# Patient Record
Sex: Male | Born: 1955 | Race: White | Hispanic: No | Marital: Married
Health system: Southern US, Community
[De-identification: ages and names within clinical notes are randomized; demographics above are authoritative.]

---

## 2005-05-05 ENCOUNTER — Emergency Department: Payer: Self-pay | Admitting: Internal Medicine

## 2008-12-20 ENCOUNTER — Ambulatory Visit: Payer: Self-pay | Admitting: Gastroenterology

## 2017-12-02 ENCOUNTER — Inpatient Hospital Stay (HOSPITAL_COMMUNITY): Payer: BC Managed Care – PPO

## 2017-12-02 ENCOUNTER — Emergency Department: Payer: BC Managed Care – PPO

## 2017-12-02 ENCOUNTER — Inpatient Hospital Stay (HOSPITAL_COMMUNITY)
Admission: EM | Admit: 2017-12-02 | Discharge: 2018-01-03 | DRG: 064 | Disposition: E | Payer: BC Managed Care – PPO | Attending: Neurology | Admitting: Neurology

## 2017-12-02 ENCOUNTER — Emergency Department (HOSPITAL_COMMUNITY): Payer: BC Managed Care – PPO

## 2017-12-02 ENCOUNTER — Emergency Department
Admission: EM | Admit: 2017-12-02 | Discharge: 2017-12-02 | Disposition: A | Discharge status: Other Acute Inpatient Hospital | Payer: BC Managed Care – PPO | Attending: Student in an Organized Health Care Education/Training Program | Admitting: Student in an Organized Health Care Education/Training Program

## 2017-12-02 DIAGNOSIS — I63511 Cerebral infarction due to unspecified occlusion or stenosis of right middle cerebral artery: Principal | ICD-10-CM | POA: Diagnosis present

## 2017-12-02 DIAGNOSIS — R402112 Coma scale, eyes open, never, at arrival to emergency department: Secondary | ICD-10-CM | POA: Diagnosis present

## 2017-12-02 DIAGNOSIS — Y92009 Unspecified place in unspecified non-institutional (private) residence as the place of occurrence of the external cause: Secondary | ICD-10-CM

## 2017-12-02 DIAGNOSIS — G8194 Hemiplegia, unspecified affecting left nondominant side: Secondary | ICD-10-CM | POA: Diagnosis present

## 2017-12-02 DIAGNOSIS — Z8673 Personal history of transient ischemic attack (TIA), and cerebral infarction without residual deficits: Secondary | ICD-10-CM | POA: Diagnosis not present

## 2017-12-02 DIAGNOSIS — R402352 Coma scale, best motor response, localizes pain, at arrival to emergency department: Secondary | ICD-10-CM | POA: Diagnosis present

## 2017-12-02 DIAGNOSIS — J9690 Respiratory failure, unspecified, unspecified whether with hypoxia or hypercapnia: Secondary | ICD-10-CM | POA: Diagnosis not present

## 2017-12-02 DIAGNOSIS — F17210 Nicotine dependence, cigarettes, uncomplicated: Secondary | ICD-10-CM | POA: Diagnosis present

## 2017-12-02 DIAGNOSIS — F1010 Alcohol abuse, uncomplicated: Secondary | ICD-10-CM

## 2017-12-02 DIAGNOSIS — G936 Cerebral edema: Secondary | ICD-10-CM | POA: Diagnosis present

## 2017-12-02 DIAGNOSIS — I6521 Occlusion and stenosis of right carotid artery: Secondary | ICD-10-CM | POA: Diagnosis not present

## 2017-12-02 DIAGNOSIS — G9340 Encephalopathy, unspecified: Secondary | ICD-10-CM | POA: Insufficient documentation

## 2017-12-02 DIAGNOSIS — Z01818 Encounter for other preprocedural examination: Secondary | ICD-10-CM

## 2017-12-02 DIAGNOSIS — W19XXXA Unspecified fall, initial encounter: Secondary | ICD-10-CM | POA: Diagnosis present

## 2017-12-02 DIAGNOSIS — R29810 Facial weakness: Secondary | ICD-10-CM | POA: Diagnosis present

## 2017-12-02 DIAGNOSIS — Z66 Do not resuscitate: Secondary | ICD-10-CM | POA: Diagnosis present

## 2017-12-02 DIAGNOSIS — I63231 Cerebral infarction due to unspecified occlusion or stenosis of right carotid arteries: Secondary | ICD-10-CM | POA: Diagnosis not present

## 2017-12-02 DIAGNOSIS — F17200 Nicotine dependence, unspecified, uncomplicated: Secondary | ICD-10-CM

## 2017-12-02 DIAGNOSIS — I639 Cerebral infarction, unspecified: Secondary | ICD-10-CM | POA: Diagnosis present

## 2017-12-02 DIAGNOSIS — G935 Compression of brain: Secondary | ICD-10-CM | POA: Diagnosis not present

## 2017-12-02 DIAGNOSIS — I63512 Cerebral infarction due to unspecified occlusion or stenosis of left middle cerebral artery: Secondary | ICD-10-CM | POA: Insufficient documentation

## 2017-12-02 DIAGNOSIS — Z91018 Allergy to other foods: Secondary | ICD-10-CM | POA: Diagnosis not present

## 2017-12-02 DIAGNOSIS — R402212 Coma scale, best verbal response, none, at arrival to emergency department: Secondary | ICD-10-CM | POA: Diagnosis present

## 2017-12-02 DIAGNOSIS — R29717 NIHSS score 17: Secondary | ICD-10-CM | POA: Diagnosis present

## 2017-12-02 DIAGNOSIS — Z515 Encounter for palliative care: Secondary | ICD-10-CM | POA: Diagnosis present

## 2017-12-02 DIAGNOSIS — Z4659 Encounter for fitting and adjustment of other gastrointestinal appliance and device: Secondary | ICD-10-CM

## 2017-12-02 DIAGNOSIS — Z9282 Status post administration of tPA (rtPA) in a different facility within the last 24 hours prior to admission to current facility: Secondary | ICD-10-CM

## 2017-12-02 DIAGNOSIS — Z87820 Personal history of traumatic brain injury: Secondary | ICD-10-CM

## 2017-12-02 DIAGNOSIS — I63031 Cerebral infarction due to thrombosis of right carotid artery: Secondary | ICD-10-CM

## 2017-12-02 DIAGNOSIS — J9600 Acute respiratory failure, unspecified whether with hypoxia or hypercapnia: Secondary | ICD-10-CM | POA: Diagnosis not present

## 2017-12-02 DIAGNOSIS — R4182 Altered mental status, unspecified: Secondary | ICD-10-CM | POA: Diagnosis present

## 2017-12-02 LAB — POCT I-STAT 3, ART BLOOD GAS (G3+)
ACID-BASE EXCESS: 4 mmol/L — AB (ref 0.0–2.0)
Bicarbonate: 28.3 mmol/L — ABNORMAL HIGH (ref 20.0–28.0)
O2 SAT: 98 %
PH ART: 7.458 — AB (ref 7.350–7.450)
TCO2: 30 mmol/L (ref 22–32)
pCO2 arterial: 39.8 mmHg (ref 32.0–48.0)
pO2, Arterial: 99 mmHg (ref 83.0–108.0)

## 2017-12-02 LAB — COMPREHENSIVE METABOLIC PANEL
ALK PHOS: 121 U/L (ref 38–126)
ALT: 12 U/L — ABNORMAL LOW (ref 17–63)
ANION GAP: 11 (ref 5–15)
AST: 28 U/L (ref 15–41)
Albumin: 3.4 g/dL — ABNORMAL LOW (ref 3.5–5.0)
BILIRUBIN TOTAL: 1.6 mg/dL — AB (ref 0.3–1.2)
BUN: 9 mg/dL (ref 6–20)
CALCIUM: 7.9 mg/dL — AB (ref 8.9–10.3)
CO2: 29 mmol/L (ref 22–32)
Chloride: 99 mmol/L — ABNORMAL LOW (ref 101–111)
Creatinine, Ser: 0.71 mg/dL (ref 0.61–1.24)
Glucose, Bld: 164 mg/dL — ABNORMAL HIGH (ref 65–99)
Potassium: 4 mmol/L (ref 3.5–5.1)
SODIUM: 139 mmol/L (ref 135–145)
TOTAL PROTEIN: 6.9 g/dL (ref 6.5–8.1)

## 2017-12-02 LAB — URINE DRUG SCREEN, QUALITATIVE (ARMC ONLY)
Amphetamines, Ur Screen: NOT DETECTED
BENZODIAZEPINE, UR SCRN: NOT DETECTED
Barbiturates, Ur Screen: NOT DETECTED
CANNABINOID 50 NG, UR ~~LOC~~: POSITIVE — AB
Cocaine Metabolite,Ur ~~LOC~~: NOT DETECTED
MDMA (ECSTASY) UR SCREEN: NOT DETECTED
Methadone Scn, Ur: NOT DETECTED
Opiate, Ur Screen: NOT DETECTED
PHENCYCLIDINE (PCP) UR S: NOT DETECTED
TRICYCLIC, UR SCREEN: NOT DETECTED

## 2017-12-02 LAB — BLOOD GAS, VENOUS
Acid-Base Excess: 4.8 mmol/L — ABNORMAL HIGH (ref 0.0–2.0)
BICARBONATE: 30.5 mmol/L — AB (ref 20.0–28.0)
Patient temperature: 37
pCO2, Ven: 47 mmHg (ref 44.0–60.0)
pH, Ven: 7.42 (ref 7.250–7.430)

## 2017-12-02 LAB — ETHANOL

## 2017-12-02 LAB — CBC WITH DIFFERENTIAL/PLATELET
BASOS ABS: 0.1 10*3/uL (ref 0–0.1)
Basophils Relative: 1 %
Eosinophils Absolute: 0 10*3/uL (ref 0–0.7)
Eosinophils Relative: 0 %
HEMATOCRIT: 52 % (ref 40.0–52.0)
HEMOGLOBIN: 17.2 g/dL (ref 13.0–18.0)
LYMPHS ABS: 1.2 10*3/uL (ref 1.0–3.6)
LYMPHS PCT: 11 %
MCH: 29.6 pg (ref 26.0–34.0)
MCHC: 33.1 g/dL (ref 32.0–36.0)
MCV: 89.4 fL (ref 80.0–100.0)
Monocytes Absolute: 0.8 10*3/uL (ref 0.2–1.0)
Monocytes Relative: 7 %
NEUTROS ABS: 9 10*3/uL — AB (ref 1.4–6.5)
NEUTROS PCT: 81 %
PLATELETS: 293 10*3/uL (ref 150–440)
RBC: 5.82 MIL/uL (ref 4.40–5.90)
RDW: 14.5 % (ref 11.5–14.5)
WBC: 11 10*3/uL — AB (ref 3.8–10.6)

## 2017-12-02 LAB — LACTIC ACID, PLASMA
LACTIC ACID, VENOUS: 1.7 mmol/L (ref 0.5–1.9)
Lactic Acid, Venous: 2.7 mmol/L (ref 0.5–1.9)

## 2017-12-02 LAB — TRIGLYCERIDES: TRIGLYCERIDES: 77 mg/dL (ref <150)

## 2017-12-02 LAB — POCT I-STAT CREATININE: CREATININE: 0.7 mg/dL (ref 0.61–1.24)

## 2017-12-02 LAB — TROPONIN I: Troponin I: <0.03 ng/mL (ref <0.03)

## 2017-12-02 LAB — GLUCOSE, CAPILLARY
GLUCOSE-CAPILLARY: 159 mg/dL — AB (ref 65–99)
GLUCOSE-CAPILLARY: 150 mg/dL — AB (ref 65–99)

## 2017-12-02 LAB — SODIUM
SODIUM: 145 mmol/L (ref 135–145)
Sodium: 139 mmol/L (ref 135–145)

## 2017-12-02 LAB — PROTIME-INR
INR: 1.05
Prothrombin Time: 13.6 seconds (ref 11.4–15.2)

## 2017-12-02 MED ORDER — ALTEPLASE (STROKE) FULL DOSE INFUSION
0.9000 mg/kg | Freq: Once | INTRAVENOUS | Status: AC
  Administered 2017-12-02: 54 mg via INTRAVENOUS

## 2017-12-02 MED ORDER — PANTOPRAZOLE SODIUM 40 MG IV SOLR
40.0000 mg | Freq: Every day | INTRAVENOUS | Status: DC
  Administered 2017-12-02: 40 mg via INTRAVENOUS
  Filled 2017-12-02: qty 40

## 2017-12-02 MED ORDER — FENTANYL CITRATE (PF) 100 MCG/2ML IJ SOLN
INTRAMUSCULAR | Status: AC | PRN
  Administered 2017-12-02: 75 ug via INTRAVENOUS

## 2017-12-02 MED ORDER — STROKE: EARLY STAGES OF RECOVERY BOOK
Freq: Once | Status: DC
  Filled 2017-12-02: qty 1

## 2017-12-02 MED ORDER — SODIUM CHLORIDE 3 % IV SOLN: INTRAVENOUS | Status: DC

## 2017-12-02 MED ORDER — PROPOFOL 1000 MG/100ML IV EMUL
INTRAVENOUS | Status: AC
  Administered 2017-12-02: 20 ug/kg/min via INTRAVENOUS
  Filled 2017-12-02: qty 100

## 2017-12-02 MED ORDER — LORAZEPAM 2 MG/ML IJ SOLN
1.0000 mg | Freq: Once | INTRAMUSCULAR | Status: AC
  Administered 2017-12-02: 1 mg via INTRAVENOUS

## 2017-12-02 MED ORDER — SODIUM CHLORIDE 0.9 % IV BOLUS (SEPSIS)
1000.0000 mL | Freq: Once | INTRAVENOUS | Status: AC
  Administered 2017-12-02: 1000 mL via INTRAVENOUS

## 2017-12-02 MED ORDER — ONDANSETRON HCL 4 MG/2ML IJ SOLN: 4.0000 mg | Freq: Four times a day (QID) | INTRAMUSCULAR | Status: DC | PRN

## 2017-12-02 MED ORDER — THIAMINE HCL 100 MG/ML IJ SOLN
100.0000 mg | Freq: Every day | INTRAMUSCULAR | Status: DC
  Administered 2017-12-03: 100 mg via INTRAVENOUS
  Filled 2017-12-02: qty 2

## 2017-12-02 MED ORDER — SODIUM CHLORIDE 0.9 % IV SOLN: 50.0000 mL | Freq: Once | INTRAVENOUS | Status: DC

## 2017-12-02 MED ORDER — ALTEPLASE 100 MG IV SOLR
INTRAVENOUS | Status: AC
  Filled 2017-12-02: qty 100

## 2017-12-02 MED ORDER — LORAZEPAM 2 MG/ML IJ SOLN
INTRAMUSCULAR | Status: AC
  Filled 2017-12-02: qty 1

## 2017-12-02 MED ORDER — SODIUM CHLORIDE 3 % IV SOLN
INTRAVENOUS | Status: DC
  Administered 2017-12-02 – 2017-12-03 (×4): 75 mL/h via INTRAVENOUS
  Filled 2017-12-02 (×7): qty 500

## 2017-12-02 MED ORDER — LABETALOL HCL 5 MG/ML IV SOLN: 10.0000 mg | INTRAVENOUS | Status: DC | PRN

## 2017-12-02 MED ORDER — LORAZEPAM 2 MG/ML IJ SOLN: 0.0000 mg | Freq: Four times a day (QID) | INTRAMUSCULAR | Status: DC

## 2017-12-02 MED ORDER — ROCURONIUM BROMIDE 50 MG/5ML IV SOLN
INTRAVENOUS | Status: AC | PRN
  Administered 2017-12-02: 60 mg via INTRAVENOUS

## 2017-12-02 MED ORDER — ETOMIDATE 2 MG/ML IV SOLN
INTRAVENOUS | Status: AC | PRN
  Administered 2017-12-02: 20 mg via INTRAVENOUS

## 2017-12-02 MED ORDER — THIAMINE HCL 100 MG/ML IJ SOLN
100.0000 mg | Freq: Once | INTRAMUSCULAR | Status: AC
  Administered 2017-12-02: 100 mg via INTRAVENOUS
  Filled 2017-12-02: qty 2

## 2017-12-02 MED ORDER — LORAZEPAM 2 MG PO TABS: 0.0000 mg | ORAL_TABLET | Freq: Two times a day (BID) | ORAL | Status: DC

## 2017-12-02 MED ORDER — PROPOFOL 1000 MG/100ML IV EMUL
5.0000 ug/kg/min | INTRAVENOUS | Status: DC
  Administered 2017-12-02: 20 ug/kg/min via INTRAVENOUS
  Administered 2017-12-03: 15 ug/kg/min via INTRAVENOUS
  Administered 2017-12-03: 30 ug/kg/min via INTRAVENOUS
  Filled 2017-12-02 (×2): qty 100

## 2017-12-02 MED ORDER — IOPAMIDOL (ISOVUE-370) INJECTION 76%
100.0000 mL | Freq: Once | INTRAVENOUS | Status: AC | PRN
  Administered 2017-12-02: 100 mL via INTRAVENOUS

## 2017-12-02 MED ORDER — ADULT MULTIVITAMIN LIQUID CH
15.0000 mL | Freq: Every day | ORAL | Status: DC
  Administered 2017-12-02 – 2017-12-03 (×2): 15 mL
  Filled 2017-12-02 (×2): qty 15

## 2017-12-02 MED ORDER — ACETAMINOPHEN 160 MG/5ML PO SOLN
650.0000 mg | Freq: Four times a day (QID) | ORAL | Status: DC | PRN
Start: 2017-12-02 — End: 2017-12-03
  Administered 2017-12-02 – 2017-12-03 (×3): 650 mg
  Filled 2017-12-02 (×3): qty 20.3

## 2017-12-02 MED ORDER — FENTANYL CITRATE (PF) 100 MCG/2ML IJ SOLN
INTRAMUSCULAR | Status: AC
  Filled 2017-12-02: qty 2

## 2017-12-02 MED ORDER — SODIUM CHLORIDE 0.9 % IV SOLN: 15.0000 mg/kg | Freq: Once | INTRAVENOUS | Status: DC

## 2017-12-02 MED ORDER — ORAL CARE MOUTH RINSE
15.0000 mL | OROMUCOSAL | Status: DC
  Administered 2017-12-02 – 2017-12-03 (×8): 15 mL via OROMUCOSAL

## 2017-12-02 MED ORDER — LORAZEPAM 2 MG/ML IJ SOLN: 0.0000 mg | Freq: Two times a day (BID) | INTRAMUSCULAR | Status: DC

## 2017-12-02 MED ORDER — SODIUM CHLORIDE 0.9 % IV SOLN
Freq: Once | INTRAVENOUS | Status: DC
  Filled 2017-12-02: qty 1000

## 2017-12-02 MED ORDER — LORAZEPAM 2 MG PO TABS: 0.0000 mg | ORAL_TABLET | Freq: Four times a day (QID) | ORAL | Status: DC

## 2017-12-02 MED ORDER — CHLORHEXIDINE GLUCONATE 0.12% ORAL RINSE (MEDLINE KIT)
15.0000 mL | Freq: Two times a day (BID) | OROMUCOSAL | Status: DC
  Administered 2017-12-02 – 2017-12-03 (×2): 15 mL via OROMUCOSAL

## 2017-12-02 NOTE — ED Notes (Signed)
Patient to [facility] via [facility].

## 2017-12-02 NOTE — Progress Notes (Signed)
CH responded to a PG for possible code stroke. Pt is groggy and being attended to by the medical team. Son is bedside. CH provided a calm presence and spent time with the son as his father was taken for a CT. CH is available for follow up if needed.    11/14/2017 1300  Clinical Encounter Type  Visited With Patient;Patient and family together;Health care provider  Visit Type Initial;Spiritual support;Code;ED (Code Stroke)  Referral From Nurse  Consult/Referral To Chaplain  Spiritual Encounters  Spiritual Needs Emotional

## 2017-12-02 NOTE — ED Notes (Signed)
Patient transported to CT 

## 2017-12-02 NOTE — Progress Notes (Signed)
eLink Physician-Brief Progress Note Patient Name: George Little DOB: 09/29/1956 MRN: [id number]   Date of Service  11/12/2017  HPI/Events of Note  Spoke with patient's wife, Costella HatcherSusan Clarrk, who desires to make her husband a DNR. Patient is already intubated and ventilated on 3% saline in setting of large CVA with cytotoxic cerebral edema. His prognosis for meaningful neurological recovery is poor.   eICU Interventions  Will make the patient a limited DNR with the exception of mechanical ventilation which he is already on.      Intervention Category Major Interventions: End of life / care limitation discussion  Lenell AntuSommer,Donevan Biller Eugene 11/05/2017, 7:41 PM

## 2017-12-02 NOTE — Progress Notes (Signed)
eLink Physician-Brief Progress Note Patient Name: George Little DOB: 04-14-1956 MRN: [id number]   Date of Service  11/28/2017  HPI/Events of Note  Fever to 100.3 F - AST and ALT are both normal.   eICU Interventions  Will order: 1. Tylenol liquid 650 mg per tube Q 6 hours PRN Temp > 99.5 F.     Intervention Category Major Interventions: Other:  Lenell AntuSommer,Steven [place] 11/05/2017, 11:04 PM

## 2017-12-02 NOTE — ED Triage Notes (Signed)
Pt arrives via [facility] from home. Pt Wife called and stated he fell and she could not get him up. Pt did not LOC, although pt has been drinking Moonshine for 3 days per [facility] report. Pt is diaphoretic, and  Responsive to pain. Awaiting EDP.

## 2017-12-02 NOTE — H&P (Addendum)
Chief Complaint: left side weakness, fall   History obtained from: Patient son and Chart   HPI:                                                                                                                                       George Little is an 61 y.o. male with PMH of cirrhosis, pancreatitis, TBI presents after a fall to [facility].   Patient has been drinking moonshine last 3 days and around 10 PM the patient started vomiting and had a fall. It appears he was awake this morning and conversational according to his wife. Initially presented as a TRAUMA ALERT. Later in the ER his son noted that his left side was weak which is new for him. Code stroke was called and his initial NIHSS was 17. He underwent CT angiogram and CT perfusion which showed a right ICA occlusion with CT perfusion showing large core in the right MCA territory of 250 cc. He received TPA at [facility] and [facility].   ED course  A repeat CT head was consistent for early acute ischemic stroke in R MCA and ACA territory with ASPECTS of 3-4 ( although official read was 10). Patient started vomiting and had a GCS of 7 on arrival and was intubated shortly after CT scan. He was subsequently admitted to the ICU team    Date last known well: 12.31.18 Time last known well: 10 AM tPA Given: yes NIHSS: 17 on arrival to [place] [facility] Baseline MRS 0    No past medical history on file.  No past surgical history on file.  No family history on file. Social History:  has no tobacco, alcohol, and drug history on file.  Allergies: No Known Allergies  Medications:                                                                                                                        I reviewed home medications   ROS:  Unable to review systems due to poor mental  status    Examination:                                                                                                      General: Thin Psych: lethargic  Eyes: No scleral injection HENT: No OP obstrucion Head: Normocephalic.  Cardiovascular: Normal rate and regular rhythm.  Respiratory: Effort normal and breath sounds normal to anterior ascultation GI: Soft.  No distension. There is no tenderness.  Skin: WDI   Neurological Examination Mental Status: Lethargic, not following any commands, nonverbal Cranial Nerves: II: Unable to assess likely left homonymous hemianopsia,  III,IV, VI: Forced gaze deviation to the right side V,VII: right Facial droop Motor: Right : Upper extremity   4/5    Left:     Upper extremity   0/5  Lower extremity   4/5     Lower extremity   0/5 Tone and bulk:normal tone throughout; no atrophy noted Sensory: Left sensory neglect Deep Tendon Reflexes: 2+ and symmetric throughout Plantars: Right: downgoing   Left: downgoing Cerebellar: Unable to assess Gait: unable to assess      Lab Results: Basic Metabolic Panel: Recent Labs  Lab 12-15-17 1201 2017-12-15 1242  NA 139  --   K 4.0  --   CL 99*  --   CO2 29  --   GLUCOSE 164*  --   BUN 9  --   CREATININE 0.71 0.70  CALCIUM 7.9*  --     CBC: Recent Labs  Lab 2017-12-15 1058  WBC 11.0*  NEUTROABS 9.0*  HGB 17.2  HCT 52.0  MCV 89.4  PLT 293    Coagulation Studies: Recent Labs    2017/12/15 1059  LABPROT 13.6  INR 1.05    Imaging: Ct Angio Head W Or Wo Contrast  Addendum Date: 12/15/17   ADDENDUM REPORT: 12-15-17 14:00 ADDENDUM: Study discussed by telephone with Dr. Thad Ranger on 2017-12-15 at 1341 hours. Electronically Signed   By: Odessa Fleming M.D.   On: Dec 15, 2017 14:00   Result Date: 12/15/2017 CLINICAL DATA:  61 year old male with altered mental status. Fell at home. Code stroke. EXAM: CT ANGIOGRAPHY HEAD AND NECK CT PERFUSION BRAIN TECHNIQUE: Multidetector CT imaging of  the head and neck was performed using the standard protocol during bolus administration of intravenous contrast. Multiplanar CT image reconstructions and MIPs were obtained to evaluate the vascular anatomy. Carotid stenosis measurements (when applicable) are obtained utilizing NASCET criteria, using the distal internal carotid diameter as the denominator. Multiphase CT imaging of the brain was performed following IV bolus contrast injection. Subsequent parametric perfusion maps were calculated using RAPID software. CONTRAST:  ISOVUE-370 IOPAMIDOL (ISOVUE-370) INJECTION 76% COMPARISON:  CT head and cervical spine at 1128 hours today. 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 No acute osseous abnormality identified. Upper chest: Centrilobular emphysema. No superior mediastinal lymphadenopathy. Other neck: Small volume retained secretions in the pharynx. Atrophied right submandibular gland. No neck mass or cervical lymphadenopathy. Aortic arch: 3 vessel arch configuration. Minimal arch atherosclerosis. Right carotid system: No brachiocephalic or right CCA origin stenosis. The right CCA is patent up to the right carotid bifurcation. The right ECA remains patent, but there is occlusion of the right ICA beginning at its origin, with an expanded appearance of the right ICA origin and bulb fills with low-density thrombus (series 10, image 55). There is only faint enhancement of the ventral lumen to the level of the distal bulb. The right ICA remains occluded to the skull base. Left carotid system: Normal left CCA origin.  Minimal soft and calcified plaque at the posterior left ICA origin. No cervical left ICA stenosis. Vertebral arteries: No proximal right subclavian artery stenosis, minor soft and calcified plaque. Normal right vertebral artery origin. Mildly non dominant right vertebral artery is patent to the skullbase without stenosis. No proximal left subclavian artery stenosis despite soft and calcified plaque. Normal left vertebral artery origin. Dominant left vertebral artery is patent to the skullbase without stenosis. CTA HEAD Posterior circulation: The non dominant distal right vertebral artery is patent to the vertebrobasilar system with an early, patent right PICA origin. Dominant distal left vertebral artery has a normal left PICA origin. Patent vertebrobasilar junction and basilar artery without stenosis. Normal SCA origins. Fetal type left PCA origin. The right posterior communicating artery appears to be occluded anteriorly, but the right PCA remains within normal limits. See series 10, image 78. Bilateral PCA branches are within normal limits. Anterior circulation: Occluded right ICA siphon and right ICA terminus. The right MCA is occluded. The right ACA is occluded. Poor collaterals. There is only some posterior right MCA and ACA territory collateral enhancement. Left ICA siphon is patent without atherosclerosis or stenosis. Normal left ophthalmic and posterior communicating artery origins. Normal left MCA origin, but the left ACA A1 segment is thread-like, appearing diminutive and non dominant. Non the less, there is left ACA enhancement throughout. No left ACA branch occlusion is identified. The left MCA M1 segment, left MCA bifurcation, and left MCA branches are within normal limits. Venous sinuses: Patent on the delayed images. Anatomic variants: Non dominant and diminutive left ACA A1 segment. Fetal type left PCA. Dominant left vertebral artery. Delayed phase: Early cytotoxic edema is now evident in the right  inferior frontal gyrus and right anterior temporal lobe as seen on series 6, image 12. Cytotoxic edema is now evident in the right ACA and anterior right MCA territories above the level of the ventricles on series 6, image 21. No intracranial mass effect. No acute intracranial hemorrhage identified. No ventriculomegaly. No abnormal parenchymal enhancement. Review of the MIP images confirms the above findings IMPRESSION: 1. Positive for Emergent Large Vessel Occlusion but CT Perfusion detects a large volume core infarct in the right MCA and ACA territories. Infarct volume estimated at 250 mL. 2. 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 3. The left ACA A1 segment is non-dominant and diminutive, and CTP suggests superimposed left ACA/MCA watershed territory ischemia. 4. Cytotoxic edema now evident in the right MCA and right ACA territories. No associated hemorrhage. No intracranial mass effect at this time. 5. Little superimposed  atherosclerosis in the head and neck. Therefore consider the possibility of cardiac or paradoxical thromboembolic disease. 6.  Emphysema (ICD10-J43.9). The findings of positive ELVO with large volume core infarct were communicated to Dr. Thad Ranger at 1:32 pmon 12-06-17 by text page via the [facility] messaging system. Electronically Signed: By: Odessa Fleming M.D. On: 2017-12-06 13:34   Ct Head Wo Contrast  Result Date: 06-Dec-2017 CLINICAL DATA:  Code stroke.  TPA administered. EXAM: CT HEAD WITHOUT CONTRAST TECHNIQUE: Contiguous axial images were obtained from the base of the skull through the vertex without intravenous contrast. COMPARISON:  None. FINDINGS: Brain: Mild generalized volume loss. No evidence of old or acute focal infarction, mass lesion, hemorrhage, hydrocephalus or extra-axial collection. Vascular: Intravascular contrast is present. Major arterial and  venous structures show flow. Skull: Negative Sinuses/Orbits: Clear/normal Other: None IMPRESSION: Negative head CT. No acute finding. Aspects 10. There is intravenous iodinated contrast. These results were communicated to Dr. Laurence Slate At 3:23 pmon 2019/01/04by text page via the [facility] messaging system. Electronically Signed   By: Paulina Fusi M.D.   On: 12/06/17 15:25   Ct Head Wo Contrast  Result Date: 12/06/2017 CLINICAL DATA:  Altered level of consciousness EXAM: CT HEAD WITHOUT CONTRAST CT CERVICAL SPINE WITHOUT CONTRAST TECHNIQUE: Multidetector CT imaging of the head and cervical spine was performed following the standard protocol without intravenous contrast. Multiplanar CT image reconstructions of the cervical spine were also generated. COMPARISON:  None. FINDINGS: CT HEAD FINDINGS Brain: No evidence of acute infarction, hemorrhage, hydrocephalus, extra-axial collection or mass lesion/mass effect. Cortical atrophy, frontal and cerebellar predominant. 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 Vascular: No hyperdense vessel or unexpected calcification. Skull: Normal. Negative for fracture or focal lesion. Sinuses/Orbits: The visualized paranasal sinuses are essentially clear. The mastoid air cells are unopacified. Other: None. CT CERVICAL SPINE FINDINGS Alignment: Straightening of the cervical spine, likely positional. Skull base and vertebrae: No acute fracture. No primary bone lesion or focal pathologic process. Soft tissues and spinal canal: No prevertebral fluid or swelling. No visible canal hematoma. Disc levels: Mild multilevel degenerative changes, most prominent at C6-7. Mild-to-moderate spinal canal narrowing at C5-6 due to calcification of the posterior longitudinal ligament (series 5/ image 53). Upper chest: Visualized lung apices are notable for mild centrilobular and paraseptal emphysematous changes. Other: Visualized thyroid is unremarkable. IMPRESSION: No evidence of acute intracranial  abnormality. Mild cortical atrophy. 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 No evidence of traumatic injury to the cervical spine. Mild degenerative changes. Mild to moderate spinal canal narrowing at C5-6 due to calcification of the posterior longitudinal ligament. Electronically Signed   By: Charline Bills M.D.   On: December 06, 2017 11:46   Ct Angio Neck W Or Wo Contrast  Addendum Date: 12/06/2017   ADDENDUM REPORT: 2017/12/06 14:00 ADDENDUM: Study discussed by telephone with Dr. Thad Ranger on 12-06-2017 at 1341 hours. Electronically Signed   By: Odessa Fleming M.D.   On: Dec 06, 2017 14:00   Result Date: 2017/12/06 CLINICAL DATA:  61 year old male with altered mental status. Fell at home. Code stroke. EXAM: CT ANGIOGRAPHY HEAD AND NECK CT PERFUSION BRAIN TECHNIQUE: Multidetector CT imaging of the head and neck was performed using the standard protocol during bolus administration of intravenous contrast. Multiplanar CT image reconstructions and MIPs were obtained to evaluate the vascular anatomy. Carotid stenosis measurements (when applicable) are obtained utilizing NASCET criteria, using the distal internal carotid diameter as the denominator. Multiphase CT imaging of the brain was performed following IV bolus contrast injection. Subsequent parametric perfusion maps were  calculated using RAPID software. CONTRAST:  ISOVUE-370 IOPAMIDOL (ISOVUE-370) INJECTION 76% COMPARISON:  CT head and cervical spine at 1128 hours today. 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 No acute osseous abnormality identified. Upper chest:  Centrilobular emphysema. No superior mediastinal lymphadenopathy. Other neck: Small volume retained secretions in the pharynx. Atrophied right submandibular gland. No neck mass or cervical lymphadenopathy. Aortic arch: 3 vessel arch configuration. Minimal arch atherosclerosis. Right carotid system: No brachiocephalic or right CCA origin stenosis. The right CCA is patent up to the right carotid bifurcation. The right ECA remains patent, but there is occlusion of the right ICA beginning at its origin, with an expanded appearance of the right ICA origin and bulb fills with low-density thrombus (series 10, image 55). There is only faint enhancement of the ventral lumen to the level of the distal bulb. The right ICA remains occluded to the skull base. Left carotid system: Normal left CCA origin. Minimal soft and calcified plaque at the posterior left ICA origin. No cervical left ICA stenosis. Vertebral arteries: No proximal right subclavian artery stenosis, minor soft and calcified plaque. Normal right vertebral artery origin. Mildly non dominant right vertebral artery is patent to the skullbase without stenosis. No proximal left subclavian artery stenosis despite soft and calcified plaque. Normal left vertebral artery origin. Dominant left vertebral artery is patent to the skullbase without stenosis. CTA HEAD Posterior circulation: The non dominant distal right vertebral artery is patent to the vertebrobasilar system with an early, patent right PICA origin. Dominant distal left vertebral artery has a normal left PICA origin. Patent vertebrobasilar junction and basilar artery without stenosis. Normal SCA origins. Fetal type left PCA origin. The right posterior communicating artery appears to be occluded anteriorly, but the right PCA remains within normal limits. See series 10, image 78. Bilateral PCA branches are within normal limits. Anterior circulation: Occluded right ICA siphon and right ICA terminus. The right MCA  is occluded. The right ACA is occluded. Poor collaterals. There is only some posterior right MCA and ACA territory collateral enhancement. Left ICA siphon is patent without atherosclerosis or stenosis. Normal left ophthalmic and posterior communicating artery origins. Normal left MCA origin, but the left ACA A1 segment is thread-like, appearing diminutive and non dominant. Non the less, there is left ACA enhancement throughout. No left ACA branch occlusion is identified. The left MCA M1 segment, left MCA bifurcation, and left MCA branches are within normal limits. Venous sinuses: Patent on the delayed images. Anatomic variants: Non dominant and diminutive left ACA A1 segment. Fetal type left PCA. Dominant left vertebral artery. Delayed phase: Early cytotoxic edema is now evident in the right inferior frontal gyrus and right anterior temporal lobe as seen on series 6, image 12. Cytotoxic edema is now evident in the right ACA and anterior right MCA territories above the level of the ventricles on series 6, image 21. No intracranial mass effect. No acute intracranial hemorrhage identified. No ventriculomegaly. No abnormal parenchymal enhancement. Review of the MIP images confirms the above findings IMPRESSION: 1. Positive for Emergent Large Vessel Occlusion but CT Perfusion detects a large volume core infarct in the right MCA and ACA territories. Infarct volume  estimated at 250 mL. 2. 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 3. The left ACA A1 segment is non-dominant and diminutive, and CTP suggests superimposed left ACA/MCA watershed territory ischemia. 4. Cytotoxic edema now evident in the right MCA and right ACA territories. No associated hemorrhage. No intracranial mass effect at this time. 5. Little superimposed atherosclerosis in the head and neck. Therefore consider the possibility of  cardiac or paradoxical thromboembolic disease. 6.  Emphysema (ICD10-J43.9). The findings of positive ELVO with large volume core infarct were communicated to Dr. Thad Ranger at 1:32 pmon 11/30/2017 by text page via the [facility] messaging system. Electronically Signed: By: Odessa Fleming M.D. On: 11/26/2017 13:34   Ct Cervical Spine Wo Contrast  Result Date: 11/04/2017 CLINICAL DATA:  Altered level of consciousness EXAM: CT HEAD WITHOUT CONTRAST CT CERVICAL SPINE WITHOUT CONTRAST TECHNIQUE: Multidetector CT imaging of the head and cervical spine was performed following the standard protocol without intravenous contrast. Multiplanar CT image reconstructions of the cervical spine were also generated. COMPARISON:  None. FINDINGS: CT HEAD FINDINGS Brain: No evidence of acute infarction, hemorrhage, hydrocephalus, extra-axial collection or mass lesion/mass effect. Cortical atrophy, frontal and cerebellar predominant. 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 Vascular: No hyperdense vessel or unexpected calcification. Skull: Normal. Negative for fracture or focal lesion. Sinuses/Orbits: The visualized paranasal sinuses are essentially clear. The mastoid air cells are unopacified. Other: None. CT CERVICAL SPINE FINDINGS Alignment: Straightening of the cervical spine, likely positional. Skull base and vertebrae: No acute fracture. No primary bone lesion or focal pathologic process. Soft tissues and spinal canal: No prevertebral fluid or swelling. No visible canal hematoma. Disc levels: Mild multilevel degenerative changes, most prominent at C6-7. Mild-to-moderate spinal canal narrowing at C5-6 due to calcification of the posterior longitudinal ligament (series 5/ image 53). Upper chest: Visualized lung apices are notable for mild centrilobular and paraseptal emphysematous changes. Other: Visualized thyroid is unremarkable. IMPRESSION: No evidence of acute intracranial abnormality. Mild cortical atrophy. Old right external capsule  lacunar infarct. No evidence of traumatic injury to the cervical spine. Mild degenerative changes. Mild to moderate spinal canal narrowing at C5-6 due to calcification of the posterior longitudinal ligament. Electronically Signed   By: Charline Bills M.D.   On: 11/25/2017 11:46   Ct Cerebral Perfusion W Contrast  Addendum Date: 11/10/2017   ADDENDUM REPORT: 11/07/2017 14:00 ADDENDUM: Study discussed by telephone with Dr. Thad Ranger on 11/24/2017 at 1341 hours. Electronically Signed   By: Odessa Fleming M.D.   On: 11/08/2017 14:00   Result Date: 11/13/2017 CLINICAL DATA:  61 year old male with altered mental status. Fell at home. Code stroke. EXAM: CT ANGIOGRAPHY HEAD AND NECK CT PERFUSION BRAIN TECHNIQUE: Multidetector CT imaging of the head and neck was performed using the standard protocol during bolus administration of intravenous contrast. Multiplanar CT image reconstructions and MIPs were obtained to evaluate the vascular anatomy. Carotid stenosis measurements (when applicable) are obtained utilizing NASCET criteria, using the distal internal carotid diameter as the denominator. Multiphase CT imaging of the brain was performed following IV bolus contrast injection. Subsequent parametric perfusion maps were calculated using RAPID software. CONTRAST:  ISOVUE-370 IOPAMIDOL (ISOVUE-370) INJECTION 76% COMPARISON:  CT head and cervical spine at 1128 hours today. 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 No acute osseous abnormality identified. Upper chest: Centrilobular emphysema. No superior mediastinal  lymphadenopathy. Other neck: Small volume retained secretions in the pharynx. Atrophied right submandibular gland. No neck mass or cervical lymphadenopathy. Aortic arch: 3 vessel arch configuration. Minimal arch atherosclerosis. Right carotid system: No brachiocephalic or right CCA origin stenosis. The right CCA is patent up to the right carotid bifurcation. The right ECA remains patent, but there is occlusion of the right ICA beginning at its origin, with an expanded appearance of the right ICA origin and bulb fills with low-density thrombus (series 10, image 55). There is only faint enhancement of the ventral lumen to the level of the distal bulb. The right ICA remains occluded to the skull base. Left carotid system: Normal left CCA origin. Minimal soft and calcified plaque at the posterior left ICA origin. No cervical left ICA stenosis. Vertebral arteries: No proximal right subclavian artery stenosis, minor soft and calcified plaque. Normal right vertebral artery origin. Mildly non dominant right vertebral artery is patent to the skullbase without stenosis. No proximal left subclavian artery stenosis despite soft and calcified plaque. Normal left vertebral artery origin. Dominant left vertebral artery is patent to the skullbase without stenosis. CTA HEAD Posterior circulation: The non dominant distal right vertebral artery is patent to the vertebrobasilar system with an early, patent right PICA origin. Dominant distal left vertebral artery has a normal left PICA origin. Patent vertebrobasilar junction and basilar artery without stenosis. Normal SCA origins. Fetal type left PCA origin. The right posterior communicating artery appears to be occluded anteriorly, but the right PCA remains within normal limits. See series 10, image 78. Bilateral PCA branches are within normal limits. Anterior circulation: Occluded right ICA siphon and right ICA terminus. The right MCA is occluded. The right ACA is occluded. Poor  collaterals. There is only some posterior right MCA and ACA territory collateral enhancement. Left ICA siphon is patent without atherosclerosis or stenosis. Normal left ophthalmic and posterior communicating artery origins. Normal left MCA origin, but the left ACA A1 segment is thread-like, appearing diminutive and non dominant. Non the less, there is left ACA enhancement throughout. No left ACA branch occlusion is identified. The left MCA M1 segment, left MCA bifurcation, and left MCA branches are within normal limits. Venous sinuses: Patent on the delayed images. Anatomic variants: Non dominant and diminutive left ACA A1 segment. Fetal type left PCA. Dominant left vertebral artery. Delayed phase: Early cytotoxic edema is now evident in the right inferior frontal gyrus and right anterior temporal lobe as seen on series 6, image 12. Cytotoxic edema is now evident in the right ACA and anterior right MCA territories above the level of the ventricles on series 6, image 21. No intracranial mass effect. No acute intracranial hemorrhage identified. No ventriculomegaly. No abnormal parenchymal enhancement. Review of the MIP images confirms the above findings IMPRESSION: 1. Positive for Emergent Large Vessel Occlusion but CT Perfusion detects a large volume core infarct in the right MCA and ACA territories. Infarct volume estimated at 250 mL. 2. 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 3. The left ACA A1 segment is non-dominant and diminutive, and CTP suggests superimposed left ACA/MCA watershed territory ischemia. 4. Cytotoxic edema now  evident in the right MCA and right ACA territories. No associated hemorrhage. No intracranial mass effect at this time. 5. Little superimposed atherosclerosis in the head and neck. Therefore consider the possibility of cardiac or paradoxical thromboembolic  disease. 6.  Emphysema (ICD10-J43.9). The findings of positive ELVO with large volume core infarct were communicated to Dr. Thad Ranger at 1:32 pmon 11/12/2017 by text page via the [facility] messaging system. Electronically Signed: By: Odessa Fleming M.D. On: 11/03/2017 13:34   Dg Chest Portable 1 View  Result Date: 11/11/2017 CLINICAL DATA:  Patient fell at home.  No reported chest complaints. EXAM: PORTABLE CHEST 1 VIEW COMPARISON:  None in PACs FINDINGS: The lungs are well-expanded and clear. The heart and pulmonary vascularity are normal. The mediastinum is normal in width. There is no pleural effusion. The observed bony thorax is normal. There is chronic deformity of the left humeral head and proximal shaft. IMPRESSION: There is no active cardiopulmonary disease. Electronically Signed   By: David  [place] M.D.   On: 11/25/2017 12:06     ASSESSMENT AND PLAN   61 y.o. male with PMH of cirrhosis, pancreatitis, TBI presents, tobacco abuse after a fall to [facility] found to have left side weakness. CT head suggestive right MCA stroke with CT perfusion showing large core of 250 mL CT angiogram showing a right ICA occlusion. Patient received IV TPA at [facility]. Not a candidate for thrombectomy as patient has large established core.    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   Acute Ischemic Stroke   # MRI of the brain without contrast #Transthoracic Echo  # Old antiplatelets as patient received IV TPA #Start or continue Atorvastatin 80 mg/other high intensity statin # BP goal: permissive HTN upto 180/105 mmHg, PRN Labetalol ordered # HBAIC and Lipid profile # Telemetry monitoring # Frequent neuro checks # NPO until passes stroke swallow screen  Cytotoxic cerebral edema  Patient has a large MCA and ACA stroke and will likely herniate without hemicraniectomy Currently no significant midline shift seen  however this is early and will likely develop in 24- 48 hrs 3% hypertonic saline started, MRI Brain  ordered at 6 AM Discussed with family goals of care - he will need hemicraniectomy for survival would likely outcome being bedridden and plegic on the left side. Family weighing options, son states that the patient himslef would not consider this quality of life.  Code status: Partial    Please page stroke NP  Or  PA  Or MD from 8am -4 pm  as this patient from this time will be  followed by the stroke.   You can look them up on [email or web address]  Password [email or web address]  I spent 45 minutes in the management of this neurologically critically ill patient presenting with a large malignant MCA stroke.  Sharryn Belding [facility] Pager Number [phone]

## 2017-12-02 NOTE — Consult Note (Signed)
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 78. Bilateral PCA branches are within normal limits. Anterior circulation: Occluded right ICA siphon and right ICA terminus. The right MCA is occluded. The right ACA is occluded. Poor collaterals. There is only some posterior right MCA and ACA territory collateral enhancement. Left ICA siphon is patent without atherosclerosis or stenosis. Normal left ophthalmic and posterior communicating artery origins. Normal left MCA origin, but the left ACA A1 segment is thread-like, appearing diminutive and non dominant. Non the less, there is left ACA enhancement throughout. No left ACA branch occlusion is identified. The left MCA M1 segment, left MCA bifurcation, and left MCA branches are within normal limits. Venous sinuses: Patent on the delayed images. Anatomic variants: Non dominant and diminutive left ACA A1 segment. Fetal type left PCA. Dominant left vertebral artery. Delayed phase: Early cytotoxic edema is now evident in the right inferior frontal gyrus and right anterior temporal lobe as seen on series 6, image 12. Cytotoxic edema is now evident in the right ACA and anterior right MCA territories above the level of the ventricles on series 6, image 21. No intracranial mass effect. No acute intracranial hemorrhage identified. No ventriculomegaly. No abnormal parenchymal enhancement.  Review of the MIP images confirms the above findings IMPRESSION: 1. Positive for Emergent Large Vessel Occlusion but CT Perfusion detects a large volume core infarct in the right MCA and ACA territories. Infarct volume estimated at 250 mL. 2. 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 3. The left ACA A1 segment is non-dominant and diminutive, and CTP suggests superimposed left ACA/MCA watershed territory ischemia. 4. Cytotoxic edema now evident in the right MCA and right ACA territories. No associated hemorrhage. No intracranial mass effect at this time. 5. Little superimposed atherosclerosis in the head and neck. Therefore consider the possibility of cardiac or paradoxical thromboembolic disease. 6.  Emphysema (ICD10-J43.9). The findings of positive ELVO with large volume core infarct were communicated to Dr. Thad Ranger at 1:32 pmon 11/27/2017 by text page via the [facility] messaging system. Electronically Signed: By: Odessa Fleming M.D. On: 11/24/2017 13:34   Dg Chest Port 1 View  Result Date: 11/06/2017 CLINICAL DATA:  Status post intubation EXAM: PORTABLE CHEST 1 VIEW COMPARISON:  None. FINDINGS: Endotracheal tube is noted in satisfactory position. The lungs are well aerated bilaterally. No focal infiltrate or sizable effusion is seen. The cardiac shadow is within normal limits. Under changes in the proximal left humerus are noted. IMPRESSION: Endotracheal tube in satisfactory position. Electronically Signed   By: Loraine Leriche  Lukens M.D.   On: 11/27/2017 15:51   Dg Chest Portable 1 View  Result Date: 11/18/2017 CLINICAL DATA:  Patient fell at home.  No reported chest complaints. EXAM: PORTABLE CHEST 1 VIEW COMPARISON:  None in PACs FINDINGS: The lungs are well-expanded and clear. The heart and pulmonary vascularity are normal. The mediastinum is normal in width. There is no pleural effusion. The  observed bony thorax is normal. There is chronic deformity of the left humeral head and proximal shaft. IMPRESSION: There is no active cardiopulmonary disease. Electronically Signed   By: David  SwazilandJordan M.D.   On: 11/18/2017 12:06       LINES/TUBES: We will be administering 3% saline peripherally for now as the patient has just received TPA.  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  The morning of admission he had been complaining of diaphoresis, subsequently dropped the TV remote and could not be aroused.  He was taken to [facility] where he was felt to have suffered from a CVA and was given TPA.  He was transferred to this facility where CTA with perfusion study showed a complete occlusion of the right internal carotid artery and a very large stroke which had already evolved with an infarct volume of 250 cc.  He was felt not to be a candidate for intervention.  ASSESSMENT / PLAN:  PULMONARY A: He was intubated in part for airway protection due to his GCS of 7 and in part because he vomited and was concerned that he may aspirate.  He currently has no indication of aspiration on examination or on chest x-ray.  CARDIOVASCULAR A: He is currently in sinus rhythm.  An echocardiogram to look for a source of emboli is pending.   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  The patient received TPA at [facility], he is not a candidate for thrombectomy based on the perfusion study.  Hypertonic saline has been ordered and permissive hypertension is being allowed in the first 24 hours.  Temperatures will be suppressed, and I will be controlling his agitation with propofol in an attempt to limit increases in ICP.  I am fully anticipating the development of DTs, he has been started on thiamine  and multivitamins and I will supplement propofol as needed for control of agitation.    FAMILY  - Updates: Discussed the situation with the patient's wife this evening and explained that he is going to evolve a very large stroke will leave him with permanent deficits and will carry significant risk to life.  She is aware that he may decline neurologically over the next 2 days and I have made her aware of the plan of care.  I have also asked her to consider whether he would wish to be supported if he knew he was going to survive with a major neurological deficit.  I have also encouraged her not to resuscitate the patient in the event of a catastrophic event.  She is contemplating the best course.  Greater than 32 minutes was spent in the care of this patient today   Penny PiaWJ Emrie Gayle, MD Pulmonary and Critical Care Medicine [facility] Pager: [phone]  11/03/2017, 5:35 PM

## 2017-12-02 NOTE — ED Notes (Signed)
PA stroke MD to make aware he has arrived.

## 2017-12-02 NOTE — Code Documentation (Signed)
61yo male arriving to [facility] via [facility] as a transfer from [facility] ED s/p tPA.  LKW reportedly 1000.  Patient with advanced imaging at [facility] - CTA showing LVO, however, CTP showing large volume core infarct in the right MCA and ACA territories per report.  Stroke team to the bedside on patient arrival.  Patient to STAT CT head per Dr. Laurence SlateAroor.  CT completed.  Patient vomited and taken to Trauma B for intubation.  NIHSS 33 prior to intubation, see documentation for details.  Patient is not an endovascular intervention candidate.  Patient to be admitted to ICU.  Bedside handoff with ED RN Shanda BumpsJessica.

## 2017-12-02 NOTE — ED Notes (Signed)
Pt taken to CT for post TPA CT. After CT scan pt began to have bile emeses. Pt was taken to trauma B and intubated for airway protection.

## 2017-12-02 NOTE — ED Notes (Signed)
CODE STROKE CALLED TO [phone] 

## 2017-12-02 NOTE — ED Notes (Signed)
Return from CT Scan.

## 2017-12-02 NOTE — Progress Notes (Signed)
Patient was transported to 4N17 without any complications.

## 2017-12-02 NOTE — ED Notes (Signed)
Report given to heather RN.

## 2017-12-02 NOTE — ED Provider Notes (Signed)
George MuseBobby Little is a 61 year old gentleman that was transferred from [facility] for stroke that was treated with TPA due to left-sided deficits.  Last known normal was approximately 09-1029 this morning.    Physical Exam  BP 104/82   Pulse 79   Resp (!) 26   SpO2 94%   Physical Exam  Constitutional: He is oriented to person, place, and time. He appears well-developed and well-nourished. No distress.  HENT:  Head: Normocephalic and atraumatic.  Right Ear: External ear normal.  Left Ear: External ear normal.  Nose: Nose normal.  Mouth/Throat: Mucous membranes are normal. No trismus in the jaw.  Eyes: Conjunctivae and EOM are normal. No scleral icterus.  Neck: Normal range of motion and phonation normal.  Cardiovascular: Normal rate and regular rhythm.  Pulmonary/Chest: Effort normal. No stridor. No respiratory distress.  Abdominal: He exhibits no distension.  Musculoskeletal: Normal range of motion. He exhibits no edema.  Neurological: He is alert and oriented to person, place, and time. GCS eye subscore is 1. GCS verbal subscore is 1. GCS motor subscore is 5.  Moves right UE and LE spontaneously and purposefully. Does not move left UE or LE.  Skin: He is not diaphoretic.  Psychiatric: He has a normal mood and affect. His behavior is normal.  Vitals reviewed.   ED Course/Procedures     Procedure Name: Intubation Date/Time: 11/15/2017 3:40 PM Performed by: Nira Connardama, Zorian Gunderman Eduardo, MD Pre-anesthesia Checklist: Patient identified, Patient being monitored, Emergency Drugs available, Timeout performed and Suction available Oxygen Delivery Method: Nasal cannula Preoxygenation: Pre-oxygenation with 100% oxygen Induction Type: Rapid sequence Ventilation: Mask ventilation without difficulty Laryngoscope Size: Glidescope Tube size: 7.5 mm Number of attempts: 1 Airway Equipment and Method: Video-laryngoscopy and Stylet Placement Confirmation: ETT inserted through vocal cords under direct  vision,  CO2 detector and Breath sounds checked- equal and bilateral Secured at: 24 cm Tube secured with: ETT holder Difficulty Due To: Difficulty was anticipated       MDM  On arrival here the patient had a GCS of 7.  However respiratory rate and saturations were reassuring.  Patient was taken to CT scanner for known count CT scans which did not reveal any ICH.  Following the CT scan patient had an episode of emesis and thus required intubation for airway protection.  Patient was admitted to the ICU following intubation.       Nira Connardama, Carzell Saldivar Eduardo, MD 11/22/2017 [phone]

## 2017-12-02 NOTE — Progress Notes (Signed)
Called by nursing regarding 40 cc/hr banana bag infusion having possible dilutive effect on the 3% hypertonic saline patient is receiving IV at 75 cc/hr to decrease cerebral edema associated with his large right MCA ischemic infarction. He has already received a dose of 100 mg thiamine IV.   Will hold banana bag to decrease dilutive effect on the hypertonic saline. Benefits of optimizing therapy to decrease the chances of worsened cerebral edema outweigh risks of not receiving banana bag. Will also order MVI x 1 per orogastric tube.  Electronically signed: Dr. Caryl PinaEric Kvion Shapley

## 2017-12-02 NOTE — ED Provider Notes (Signed)
[facility] Emergency Department Provider Note    First MD Initiated Contact with Patient 11/10/2017 1057     (approximate)  I have reviewed the triage vital signs and the nursing notes.   HISTORY  Chief Complaint Fall  Level V Caveat:  Acute toxic encephalopathy  HPI George Little is a 61 y.o. male arrives via EMS for call out of fall.  Patient found intoxicated.  According to family he has been drinking moonshine for the past 3 days.  Family states that patient fell out of bed and they could not get him up.  No report of LOC.  Patient arrives to the ER critically ill-appearing diaphoretic but protecting his airway.  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  No further history available at this time.  No family available.  Patient does awaken to painful stimuli.  History reviewed. No pertinent past medical history. No family history on file. History reviewed. No pertinent surgical history. Patient Active Problem List   Diagnosis Date Noted  . Stroke (HCC) 11/06/2017      Prior to Admission medications   Medication Sig Start Date End Date Taking? Authorizing Provider  traMADol (ULTRAM) 50 MG tablet Take 50 mg by mouth every 6 (six) hours as needed for moderate pain.    Yes [provider]    Allergies Patient has no known allergies.    Social History Social History   Tobacco Use  . Smoking status: Not on file  Substance Use Topics  . Alcohol use: Not on file  . Drug use: Not on file    Review of Systems Unable to assess 2/2 acuity of condition and encephalopathy ____________________________________________   PHYSICAL EXAM:  VITAL SIGNS: Vitals:   11/28/2017 1400 11/09/2017 1415  BP: 117/74 (!) 143/81  Pulse: 71 68  Resp: 18 18  Temp:    SpO2:  99%    Constitutional: Critically ill appearing, diaphoretic but in no respiratory distress. Eyes: Conjunctivae are 2 mm and equal, does seem to have  right word gaze deviation Head: Atraumatic. Nose: No congestion/rhinnorhea. Mouth/Throat: Mucous membranes are moist.   Neck: No stridor. Painless ROM.  Cardiovascular: Normal rate, regular rhythm. Grossly normal heart sounds.  Good peripheral circulation. Respiratory: Normal respiratory effort.  No retractions. Lungs CTAB. Gastrointestinal: Soft and nontender. No distention. No abdominal bruits. No CVA tenderness. Musculoskeletal: No lower extremity tenderness nor edema.  No joint effusions. Neurologic: Patient encephalopathic with right gaze deviation does seem to have left-sided weakness.  Not moving left leg, seem to have some spasticity in left UE.  Limited neuro exam due to encephalopathy Skin:  Skin is warm, diaphoretic but no rashes or abrasions noted. No rash noted. Psychiatric: Able to assess due to acute encephalopathy ____________________________________________   LABS (all labs ordered are listed, but only abnormal results are displayed)  Results for orders placed or performed during the hospital encounter of 11/03/2017 (from the past 24 hour(s))  Glucose, capillary     Status: Abnormal   Collection Time: 11/04/2017 10:55 AM  Result Value Ref Range   Glucose-Capillary 159 (H) 65 - 99 mg/dL  CBC with Differential/Platelet     Status: Abnormal   Collection Time: 11/23/2017 10:58 AM  Result Value Ref Range   WBC 11.0 (H) 3.8 - 10.6 K/uL   RBC 5.82 4.40 - 5.90 MIL/uL   Hemoglobin 17.2 13.0 - 18.0 g/dL   HCT 78.2 95.6 - 21.3 %   MCV 89.4 80.0 -  100.0 fL   MCH 29.6 26.0 - 34.0 pg   MCHC 33.1 32.0 - 36.0 g/dL   RDW 16.1 09.6 - 04.5 %   Platelets 293 150 - 440 K/uL   Neutrophils Relative % 81 %   Neutro Abs 9.0 (H) 1.4 - 6.5 K/uL   Lymphocytes Relative 11 %   Lymphs Abs 1.2 1.0 - 3.6 K/uL   Monocytes Relative 7 %   Monocytes Absolute 0.8 0.2 - 1.0 K/uL   Eosinophils Relative 0 %   Eosinophils Absolute 0.0 0 - 0.7 K/uL   Basophils Relative 1 %   Basophils Absolute 0.1 0 - 0.1  K/uL  Lactic acid, plasma     Status: Abnormal   Collection Time: 11/08/2017 10:58 AM  Result Value Ref Range   Lactic Acid, Venous 2.7 (HH) 0.5 - 1.9 mmol/L  Ethanol     Status: None   Collection Time: 11/13/2017 10:58 AM  Result Value Ref Range   Alcohol, Ethyl (B) <10 <10 mg/dL  Urine Drug Screen, Qualitative ([facility] only)     Status: Abnormal   Collection Time: 11/11/2017 10:59 AM  Result Value Ref Range   Tricyclic, Ur Screen NONE DETECTED NONE DETECTED   Amphetamines, Ur Screen NONE DETECTED NONE DETECTED   MDMA (Ecstasy)Ur Screen NONE DETECTED NONE DETECTED   Cocaine Metabolite,Ur [place] NONE DETECTED NONE DETECTED   Opiate, Ur Screen NONE DETECTED NONE DETECTED   Phencyclidine (PCP) Ur S NONE DETECTED NONE DETECTED   Cannabinoid 50 Ng, Ur [place] POSITIVE (A) NONE DETECTED   Barbiturates, Ur Screen NONE DETECTED NONE DETECTED   Benzodiazepine, Ur Scrn NONE DETECTED NONE DETECTED   Methadone Scn, Ur NONE DETECTED NONE DETECTED  Protime-INR     Status: None   Collection Time: 11/19/2017 10:59 AM  Result Value Ref Range   Prothrombin Time 13.6 11.4 - 15.2 seconds   INR 1.05   Blood gas, venous     Status: Abnormal   Collection Time: 11/04/2017 12:01 PM  Result Value Ref Range   pH, Ven 7.42 7.250 - 7.430   pCO2, Ven 47 44.0 - 60.0 mmHg   Bicarbonate 30.5 (H) 20.0 - 28.0 mmol/L   Acid-Base Excess 4.8 (H) 0.0 - 2.0 mmol/L   Patient temperature 37.0    Collection site VENOUS    Sample type VENOUS   Comprehensive metabolic panel     Status: Abnormal   Collection Time: 11/03/2017 12:01 PM  Result Value Ref Range   Sodium 139 135 - 145 mmol/L   Potassium 4.0 3.5 - 5.1 mmol/L   Chloride 99 (L) 101 - 111 mmol/L   CO2 29 22 - 32 mmol/L   Glucose, Bld 164 (H) 65 - 99 mg/dL   BUN 9 6 - 20 mg/dL   Creatinine, Ser 4.09 0.61 - 1.24 mg/dL   Calcium 7.9 (L) 8.9 - 10.3 mg/dL   Total Protein 6.9 6.5 - 8.1 g/dL   Albumin 3.4 (L) 3.5 - 5.0 g/dL   AST 28 15 - 41 U/L   ALT 12 (L) 17 - 63 U/L   Alkaline  Phosphatase 121 38 - 126 U/L   Total Bilirubin 1.6 (H) 0.3 - 1.2 mg/dL   GFR calc non Af Amer >60 >60 mL/min   GFR calc Af Amer >60 >60 mL/min   Anion gap 11 5 - 15  Troponin I     Status: None   Collection Time: 11/10/2017 12:01 PM  Result Value Ref Range   Troponin I <0.03 <  0.03 ng/mL  Glucose, capillary     Status: Abnormal   Collection Time: 11/25/2017 12:29 PM  Result Value Ref Range   Glucose-Capillary 150 (H) 65 - 99 mg/dL  I-STAT creatinine     Status: None   Collection Time: 11/20/2017 12:42 PM  Result Value Ref Range   Creatinine, Ser 0.70 0.61 - 1.24 mg/dL   ____________________________________________  EKG My review and personal interpretation at Time: 11:02   Indication: ams  Rate: 60  Rhythm: sinus Axis: normal Other: normal intervals, no stemi, ____________________________________________  RADIOLOGY  I personally reviewed all radiographic images ordered to evaluate for the above acute complaints and reviewed radiology reports and findings.  These findings were personally discussed with the patient.  Please see medical record for radiology report.  ____________________________________________   PROCEDURES  Procedure(s) performed:  .Critical Care Performed by: Willy Eddyobinson, Caelynn Marshman, MD Authorized by: Willy Eddyobinson, Inette Doubrava, MD   Critical care provider statement:    Critical care time (minutes):  45   Critical care time was exclusive of:  Separately billable procedures and treating other patients   Critical care was necessary to treat or prevent imminent or life-threatening deterioration of the following conditions:  CNS failure or compromise   Critical care was time spent personally by me on the following activities:  Development of treatment plan with patient or surrogate, discussions with consultants, evaluation of patient's response to treatment, examination of patient, obtaining history from patient or surrogate, ordering and performing treatments and interventions,  ordering and review of laboratory studies, ordering and review of radiographic studies, pulse oximetry, re-evaluation of patient's condition and review of old charts      Critical Care performed: yes  ____________________________________________   INITIAL IMPRESSION / ASSESSMENT AND PLAN / ED COURSE  Pertinent labs & imaging results that were available during my care of the patient were reviewed by me and considered in my medical decision making (see chart for details).  DDX: Dehydration, sepsis, pna, uti, hypoglycemia, cva, drug effect, withdrawal, encephalitis   Claudie FishermanBobby G Latchford is a 61 y.o. who presents to the ED with altered mental status as described above.  Differential is broad at this time.  Patient critically ill-appearing but is protecting his airway.  Will provide IV hydration.  Report of significant alcohol ingestion.  Report of possibly chronic left-sided deficits reported to previous TBI?  Glucose is normal.  Certainly concern for traumatic head injury.  Patient will be taken immediately to CT head.  Blood work will be sent off to evaluate for metabolic and toxic encephalopathy toxic alcohols also on the differential.  The patient will be placed on continuous pulse oximetry and telemetry for monitoring.  Laboratory evaluation will be sent to evaluate for the above complaints.     Clinical Course as of Dec 02 1638  Mon Dec 02, 2017  1200 Etoh negative.  Will start empiric fomepizole until access alcohols return.  Unable to reach family.  Will place on C waw protocol.  Vital signs are stable therefore have lower suspicion for active withdrawal but also remains on differential.  CT head with no evidence of acute traumatic injury.  [PR]  1218 Son now at bedside states that patient was normal this morning got up had one episode of vomiting and then fell to the floor with left-sided weakness.  Last seen normal was at 10:00.  This is more concerning for stroke now given additional  history.  Will call code stroke.  [PR]  1226 Lactate noted to be elevated  2.7.  We will continue IV hydration.  Contacted lab as CMP is still not resulted.  Will take to CT for CT angiogram to evaluate and rule out LV O.  [PR]  1315 Remains HDS.  Protecting airway.  CTA concerning from stroke.   [PR]  1325 Dr. Thad Rangereynolds has discussed risk/benefits with patients family at bedside and they will proceed with tPA.  [facility].  [PR]  1355 Remains hemodynamically stable.  TPA infusing.  Patient [facility] ER for neuro IR evaluation.  [PR]    Clinical Course User Index [PR] Willy Eddyobinson, Fabiano Ginley, MD     ____________________________________________   FINAL CLINICAL IMPRESSION(S) / ED DIAGNOSES  Final diagnoses:  Cerebrovascular accident (CVA) due to occlusion of left middle cerebral artery (HCC)  Acute encephalopathy      NEW MEDICATIONS STARTED DURING THIS VISIT:  This SmartLink is deprecated. Use AVSMEDLIST instead to display the medication list for a patient.   Note:  This document was prepared using Dragon voice recognition software and may include unintentional dictation errors.     Willy Eddyobinson, Josefina Rynders, MD Dec 28, 2016 [phone]

## 2017-12-02 NOTE — Code Documentation (Addendum)
Code Stroke paged at 1223. Pt arrived via EMS after a fall, per EMS pt had been drinking moonshine for several days and then had a fall last night, EMS initially reported LKW last night, at 1200 pts son Webb LawsJames Bourdeau came to bedside and states pt was at baseline this AM, talking to his nephew when he had a fall and then left sided weakness, Dr. Roxan Hockeyobinson initiated code stroke after speaking to pts son, Dr. Thad Rangereynolds to bedside at 1231, upon assessment Dr. Thad Rangereynolds ordered Iv ativan, pt profusely sweating, pt with gaze to right, not verbally responding and not moving left side, pt taken to Ct for CTA and CTP, see stroke documentation for timeline. Pt received tPA at 1331, [facility] after tPA completion, delay in code stroke initiation due to unclear LKW until son arrived, report given to Ed charge [employer] Sabrina, report given to EMS for transfer

## 2017-12-02 NOTE — ED Triage Notes (Addendum)
Pt arrives via [facility] post TPA from [facility] after CTA showed Large vessel occlusion. Pt arrives diaphoretic non verbal only moving right side.

## 2017-12-02 NOTE — ED Notes (Signed)
Pt arousal to sternum rub

## 2017-12-02 NOTE — Consult Note (Addendum)
Referring Physician: Roxan Hockeyobinson    Chief Complaint: Fall, left sided weakness  HPI: George Little is an 61 y.o. male who has been on a moonshine drinking binge for the past 3 days.  Awakened today and was able to speak to his wife.  She later heard him vomit and then fall.  EMS was called and the patient was brought in for evaluation.  Initially based on history of ETOH consumption it was felt that the patient was drunk.  Head CT was performed that was felt to be unremarkable.  Once son arrived it was determines that the patient had a new left sided weakness.  Code stroke was called at that time.  Initial NIHSS of 17.  Date last known well: Date: 07/19/2017 Time last known well: Time: 10:00 tPA Given: Yes  Past medical history: Cirrhosis, pancreatitis, TBI with resultant left sided deficits  History reviewed. No pertinent surgical history.  Family history: Father with cirrhosis.  Mother and sister with cancer  Social History:  Patient smokes, drinks heavily and uses marijuana  Allergies: NKDA  Medications: I have reviewed the patient's current medications. Prior to Admission:  Prior to Admission medications   Medication Sig Start Date End Date Taking? Authorizing Provider  traMADol (ULTRAM) 50 MG tablet Take 50 mg by mouth every 6 (six) hours as needed.   Yes [provider]    ROS: Unable to provide due mental status  Physical Examination: Blood pressure 113/71, pulse 70, temperature 98 F (36.7 C), resp. rate 19.  HEENT-  Normocephalic, no lesions, without obvious abnormality.  Normal external eye and conjunctiva.  Normal TM's bilaterally.  Normal auditory canals and external ears. Normal external nose, mucus membranes and septum.  Normal pharynx. Cardiovascular- S1, S2 normal, pulses palpable throughout   Lungs- chest clear, no wheezing, rales, normal symmetric air entry Abdomen- soft, non-tender; bowel sounds normal; no masses,  no organomegaly Extremities- no  edema Lymph-no adenopathy palpable Musculoskeletal-no joint tenderness, deformity or swelling Skin-warm and dry, no hyperpigmentation, vitiligo, or suspicious lesions  Neurological Examination   Mental Status: Nonverbal initially but patient later became verbal.  Speech slurred.  Follows some simple commands. Cranial Nerves: II: Discs flat bilaterally; Does not blink to bilateral confrontation, pupils equal, round, reactive to light and accommodation III,IV, VI: Eye deviation to the right V,VII: corneals intact bilaterally VIII: hearing normal bilaterally IX,X: unable to test XI: unable to test XII: unable to test Motor: Spontaneously moves right upper and lower extremity.  Left arm flexed and close to body.  Left leg extended with increased tone.  Tremors noted on the left side of the body at times. Sensory: Responds to noxious stimuli in all extremities Deep Tendon Reflexes: 2+ in the upper extremities.  Unable to obtain in the lower extremities Plantars: Right: mute   Left: upgoing Cerebellar: Unable to test Gait: unable to test   Laboratory Studies:  Basic Metabolic Panel: Recent Labs  Lab 02-May-2017 1201  NA 139  K 4.0  CL 99*  CO2 29  GLUCOSE 164*  BUN 9  CREATININE 0.71  CALCIUM 7.9*    Liver Function Tests: Recent Labs  Lab 02-May-2017 1201  AST 28  ALT 12*  ALKPHOS 121  BILITOT 1.6*  PROT 6.9  ALBUMIN 3.4*   No results for input(s): LIPASE, AMYLASE in the last 168 hours. No results for input(s): AMMONIA in the last 168 hours.  CBC: Recent Labs  Lab 02-May-2017 1058  WBC 11.0*  NEUTROABS 9.0*  HGB 17.2  HCT 52.0  MCV 89.4  PLT 293    Cardiac Enzymes: Recent Labs  Lab Jan 01, 2018 1201  TROPONINI <0.03    BNP: Invalid input(s): POCBNP  CBG: Recent Labs  Lab 01-Jan-2018 1055 01-01-2018 1229  GLUCAP 159* 150*    Microbiology: No results found for this or any previous visit.  Coagulation Studies: No results for input(s): LABPROT, INR in the  last 72 hours.  Urinalysis: No results for input(s): COLORURINE, LABSPEC, PHURINE, GLUCOSEU, HGBUR, BILIRUBINUR, KETONESUR, PROTEINUR, UROBILINOGEN, NITRITE, LEUKOCYTESUR in the last 168 hours.  Invalid input(s): APPERANCEUR  Lipid Panel: No results found for: CHOL, TRIG, HDL, CHOLHDL, VLDL, LDLCALC  HgbA1C: No results found for: HGBA1C  Urine Drug Screen:      Component Value Date/Time   LABOPIA NONE DETECTED 2018/01/01 1059   COCAINSCRNUR NONE DETECTED Jan 01, 2018 1059   LABBENZ NONE DETECTED 01-01-2018 1059   AMPHETMU NONE DETECTED 01-01-18 1059   THCU POSITIVE (A) 01-01-2018 1059   LABBARB NONE DETECTED 01/01/2018 1059    Alcohol Level:  Recent Labs  Lab 01/01/18 1058  ETH <10     Imaging: Ct Head Wo Contrast  Result Date: 01-Jan-2018 CLINICAL DATA:  Altered level of consciousness EXAM: CT HEAD WITHOUT CONTRAST CT CERVICAL SPINE WITHOUT CONTRAST TECHNIQUE: Multidetector CT imaging of the head and cervical spine was performed following the standard protocol without intravenous contrast. Multiplanar CT image reconstructions of the cervical spine were also generated. COMPARISON:  None. FINDINGS: CT HEAD FINDINGS Brain: No evidence of acute infarction, hemorrhage, hydrocephalus, extra-axial collection or mass lesion/mass effect. Cortical atrophy, frontal and cerebellar predominant. 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 Vascular: No hyperdense vessel or unexpected calcification. Skull: Normal. Negative for fracture or focal lesion. Sinuses/Orbits: The visualized paranasal sinuses are essentially clear. The mastoid air cells are unopacified. Other: None. CT CERVICAL SPINE FINDINGS Alignment: Straightening of the cervical spine, likely positional. Skull base and vertebrae: No acute fracture. No primary bone lesion or focal pathologic process. Soft tissues and spinal canal: No prevertebral fluid or swelling. No visible canal hematoma. Disc levels: Mild multilevel degenerative  changes, most prominent at C6-7. Mild-to-moderate spinal canal narrowing at C5-6 due to calcification of the posterior longitudinal ligament (series 5/ image 53). Upper chest: Visualized lung apices are notable for mild centrilobular and paraseptal emphysematous changes. Other: Visualized thyroid is unremarkable. IMPRESSION: No evidence of acute intracranial abnormality. Mild cortical atrophy. 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 No evidence of traumatic injury to the cervical spine. Mild degenerative changes. Mild to moderate spinal canal narrowing at C5-6 due to calcification of the posterior longitudinal ligament. Electronically Signed   By: Charline Bills M.D.   On: 01/01/2018 11:46   Ct Cervical Spine Wo Contrast  Result Date: 01-01-2018 CLINICAL DATA:  Altered level of consciousness EXAM: CT HEAD WITHOUT CONTRAST CT CERVICAL SPINE WITHOUT CONTRAST TECHNIQUE: Multidetector CT imaging of the head and cervical spine was performed following the standard protocol without intravenous contrast. Multiplanar CT image reconstructions of the cervical spine were also generated. COMPARISON:  None. FINDINGS: CT HEAD FINDINGS Brain: No evidence of acute infarction, hemorrhage, hydrocephalus, extra-axial collection or mass lesion/mass effect. Cortical atrophy, frontal and cerebellar predominant. 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 Vascular: No hyperdense vessel or unexpected calcification. Skull: Normal. Negative for fracture or focal lesion. Sinuses/Orbits: The visualized paranasal sinuses are essentially clear. The mastoid air cells are unopacified. Other: None. CT CERVICAL SPINE FINDINGS Alignment: Straightening of the cervical spine, likely positional. Skull base and vertebrae: No acute fracture. No primary  bone lesion or focal pathologic process. Soft tissues and spinal canal: No prevertebral fluid or swelling. No visible canal hematoma. Disc levels: Mild multilevel degenerative changes, most  prominent at C6-7. Mild-to-moderate spinal canal narrowing at C5-6 due to calcification of the posterior longitudinal ligament (series 5/ image 53). Upper chest: Visualized lung apices are notable for mild centrilobular and paraseptal emphysematous changes. Other: Visualized thyroid is unremarkable. IMPRESSION: No evidence of acute intracranial abnormality. Mild cortical atrophy. 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 No evidence of traumatic injury to the cervical spine. Mild degenerative changes. Mild to moderate spinal canal narrowing at C5-6 due to calcification of the posterior longitudinal ligament. Electronically Signed   By: Charline BillsSriyesh  Krishnan M.D.   On: 2017-01-18 11:46   Dg Chest Portable 1 View  Result Date: 2017-01-18 CLINICAL DATA:  Patient fell at home.  No reported chest complaints. EXAM: PORTABLE CHEST 1 VIEW COMPARISON:  None in PACs FINDINGS: The lungs are well-expanded and clear. The heart and pulmonary vascularity are normal. The mediastinum is normal in width. There is no pleural effusion. The observed bony thorax is normal. There is chronic deformity of the left humeral head and proximal shaft. IMPRESSION: There is no active cardiopulmonary disease. Electronically Signed   By: David  SwazilandJordan M.D.   On: 2017-01-18 12:06    Assessment: 61 y.o. male presenting after a fall with left sided weakness.  Initial head CT reviewed and unremarkable with some question of a hyperdense left carotid.  Patient sent for CTA and perfusion study.  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  Case discussed with Dr. Roda ShuttersXu.  It was decided to give patient tPA.  Risks and benefits discussed with son who felt patient would rather die than be in this condition.  Verbal consent obtained.  tPA administered and [facility].    Stroke Risk Factors - smoking  Plan: 1. HgbA1c, fasting lipid panel 2. MRI, MRA  of the brain without contrast 3. PT consult, OT  consult, Speech consult 4. Echocardiogram 5. Carotid dopplers 6. Prophylactic therapy-None 7. NPO until RN stroke swallow screen 8. Telemetry monitoring 9. Frequent neuro checks 10. CIWA 11. Concern for possible seizure activity.  1mg  Ativan administered,  May benefit from seizure precautions.    This patient is critically ill and at significant risk of neurological worsening, death and care requires constant monitoring of vital signs, hemodynamics,respiratory and cardiac monitoring, neurological assessment, discussion with family, other specialists and medical decision making of high complexity. I spent 55 minutes of neurocritical care time  in the care of  this patient.   Thana FarrLeslie Caya Soberanis, MD Neurology [phone] 2017-01-18, 12:59 PM

## 2017-12-03 ENCOUNTER — Inpatient Hospital Stay (HOSPITAL_COMMUNITY): Payer: BC Managed Care – PPO

## 2017-12-03 DIAGNOSIS — I6521 Occlusion and stenosis of right carotid artery: Secondary | ICD-10-CM

## 2017-12-03 DIAGNOSIS — F17200 Nicotine dependence, unspecified, uncomplicated: Secondary | ICD-10-CM

## 2017-12-03 DIAGNOSIS — G936 Cerebral edema: Secondary | ICD-10-CM

## 2017-12-03 DIAGNOSIS — I63231 Cerebral infarction due to unspecified occlusion or stenosis of right carotid arteries: Secondary | ICD-10-CM

## 2017-12-03 DIAGNOSIS — F1010 Alcohol abuse, uncomplicated: Secondary | ICD-10-CM

## 2017-12-03 DIAGNOSIS — G935 Compression of brain: Secondary | ICD-10-CM

## 2017-12-03 LAB — CBC WITH DIFFERENTIAL/PLATELET
BASOS PCT: 0 %
Basophils Absolute: 0 10*3/uL (ref 0.0–0.1)
Eosinophils Absolute: 0 10*3/uL (ref 0.0–0.7)
Eosinophils Relative: 0 %
HEMATOCRIT: 46 % (ref 39.0–52.0)
HEMOGLOBIN: 14.9 g/dL (ref 13.0–17.0)
Lymphocytes Relative: 12 %
Lymphs Abs: 1.2 10*3/uL (ref 0.7–4.0)
MCH: 30.3 pg (ref 26.0–34.0)
MCHC: 32.4 g/dL (ref 30.0–36.0)
MCV: 93.5 fL (ref 78.0–100.0)
MONOS PCT: 7 %
Monocytes Absolute: 0.8 10*3/uL (ref 0.1–1.0)
NEUTROS ABS: 8.5 10*3/uL — AB (ref 1.7–7.7)
NEUTROS PCT: 81 %
Platelets: 173 10*3/uL (ref 150–400)
RBC: 4.92 MIL/uL (ref 4.22–5.81)
RDW: 15.5 % (ref 11.5–15.5)
WBC: 10.5 10*3/uL (ref 4.0–10.5)

## 2017-12-03 LAB — COMPREHENSIVE METABOLIC PANEL
ALBUMIN: 2.6 g/dL — AB (ref 3.5–5.0)
ALT: 11 U/L — ABNORMAL LOW (ref 17–63)
AST: 21 U/L (ref 15–41)
Alkaline Phosphatase: 93 U/L (ref 38–126)
Anion gap: 6 (ref 5–15)
BUN: 8 mg/dL (ref 6–20)
CHLORIDE: 117 mmol/L — AB (ref 101–111)
CO2: 25 mmol/L (ref 22–32)
Calcium: 7.6 mg/dL — ABNORMAL LOW (ref 8.9–10.3)
Creatinine, Ser: 0.69 mg/dL (ref 0.61–1.24)
GFR calc Af Amer: >60 mL/min (ref >60)
GLUCOSE: 115 mg/dL — AB (ref 65–99)
POTASSIUM: 3.6 mmol/L (ref 3.5–5.1)
Sodium: 148 mmol/L — ABNORMAL HIGH (ref 135–145)
Total Bilirubin: 1 mg/dL (ref 0.3–1.2)
Total Protein: 5.5 g/dL — ABNORMAL LOW (ref 6.5–8.1)

## 2017-12-03 LAB — LIPID PANEL
CHOL/HDL RATIO: 2.9 ratio
Cholesterol: 96 mg/dL (ref 0–200)
HDL: 33 mg/dL — AB (ref >40)
LDL Cholesterol: 49 mg/dL (ref 0–99)
TRIGLYCERIDES: 70 mg/dL (ref <150)
VLDL: 14 mg/dL (ref 0–40)

## 2017-12-03 LAB — MRSA PCR SCREENING: MRSA by PCR: NEGATIVE

## 2017-12-03 LAB — SODIUM
Sodium: 152 mmol/L — ABNORMAL HIGH (ref 135–145)
Sodium: 157 mmol/L — ABNORMAL HIGH (ref 135–145)

## 2017-12-03 LAB — MAGNESIUM: MAGNESIUM: 1.8 mg/dL (ref 1.7–2.4)

## 2017-12-03 LAB — AMYLASE: AMYLASE: 19 U/L — AB (ref 28–100)

## 2017-12-03 LAB — HIV ANTIBODY (ROUTINE TESTING W REFLEX): HIV Screen 4th Generation wRfx: NONREACTIVE

## 2017-12-03 LAB — HEMOGLOBIN A1C
Hgb A1c MFr Bld: 5 % (ref 4.8–5.6)
MEAN PLASMA GLUCOSE: 96.8 mg/dL

## 2017-12-03 LAB — PHOSPHORUS: Phosphorus: 2.6 mg/dL (ref 2.5–4.6)

## 2017-12-03 MED ORDER — ACETAMINOPHEN 10 MG/ML IV SOLN: 1000.0000 mg | Freq: Once | INTRAVENOUS | Status: DC

## 2017-12-03 MED ORDER — SODIUM CHLORIDE 0.9 % IV SOLN
3.0000 mg/h | INTRAVENOUS | Status: DC
  Administered 2017-12-03: 3 mg/h via INTRAVENOUS
  Filled 2017-12-03: qty 10

## 2017-12-03 MED ORDER — LORAZEPAM 2 MG/ML IJ SOLN
2.0000 mg | INTRAMUSCULAR | Status: DC
  Administered 2017-12-03 – 2017-12-04 (×2): 2 mg via INTRAVENOUS
  Filled 2017-12-03 (×2): qty 1

## 2017-12-03 MED ORDER — SODIUM CHLORIDE 0.9 % IV BOLUS (SEPSIS)
500.0000 mL | Freq: Once | INTRAVENOUS | Status: AC
  Administered 2017-12-03: 500 mL via INTRAVENOUS

## 2017-12-03 NOTE — Progress Notes (Signed)
Patient sleeping wife of 40 years at bedside.  She states that the patient is an alcoholic. She explained that she is caring for a grandson who has health issues.  She says she has been praying a lot. I prayed with the wife and for the patient.  I will ask day chaplain to follow-up.

## 2017-12-03 NOTE — Progress Notes (Signed)
Per Dr. Roda ShuttersXu, f/u TPA MRI/CT to be canceled. Dicie BeamFrazier, Willene Holian RN BSN.

## 2017-12-03 NOTE — Progress Notes (Signed)
Patient transported on vent from 4N-17 to CT and back without complication.

## 2017-12-03 NOTE — Progress Notes (Signed)
Pt was taken to CT and returned to 4N-17 safely. Based on results of scan - on-call neurology MD spoke w/ pt's wife regarding how to proceed w/ care regimen. Decision was made by the wife to place a palliative care consult and denied surgical intervention.   While on the phone w/ MD - RN informed MD of low urine output (and amber in color) for shift - about 140mL for the shift. MD informed RN to proceed w/ administering a 500mL bolus of 0.9 NS - benefit outweighed the risk even w/ hypertonic saline gtt running.  Will continue to monitor.  Francia GreavesSavannah R Merlean Pizzini, RN

## 2017-12-03 NOTE — Progress Notes (Signed)
PULMONARY / CRITICAL CARE MEDICINE   Name: George Little MRN: [id number] DOB: 1956-09-24    ADMISSION DATE:  2017/12/12   CHIEF COMPLAINT: Altered mental status  HISTORY OF PRESENT ILLNESS:        This is a 62 year old alcoholic who has previously been hospitalized for severe pancreatitis who just completed a 3-day binge on the day prior to admission.  This morning he was diaphoretic and subsequently dropped the TV remote and was found to be unresponsive.  He was taken to [facility] where he was intubated partly for airway protection and in part because he had had some nausea and vomiting and there was concerns for potential aspiration.  He was suspected of having suffered from a CVA and TPA was given.  Of note family denies any risks for the administration of TPA, they deny any past history of gastrointestinal bleeding, his last eye surgery was 5 years ago, he has had no recent surgery on his back or brain. On arrival here a CTA and perfusion study were performed showing complete occlusion of the right internal carotid artery.  He has a very large stroke volume of 250 cc and is felt not to be a candidate for embolectomy.  A CT scan of the head obtained at 02:40, shows an involving infarct involving the right ACA and MCA territory as well as the anterior left ACA territory.  There is a right to left shift of 10 mm.   He remains intubated and mechanically ventilated and sedated on 20 mcg/kg/min of propofol during my exam.      PAST MEDICAL HISTORY :  He  has no past medical history on file.  PAST SURGICAL HISTORY: He  has no past surgical history on file.  Allergies  Allergen Reactions  . Cantaloupe (Diagnostic) Anaphylaxis  . Watermelon Flavor Anaphylaxis    No current facility-administered medications on file prior to encounter.    Current Outpatient Medications on File Prior to Encounter  Medication Sig  . acetaminophen (TYLENOL) 500 MG tablet Take 1,000 mg by mouth every  6 (six) hours as needed for mild pain.    FAMILY HISTORY:  His has no family status information on file.    REVIEW OF SYSTEMS:   unobtainable  SUBJECTIVE:  Unobtainable  VITAL SIGNS: BP 106/74   Pulse (!) 57   Temp 98.2 F (36.8 C) (Axillary)   Resp 17   Ht 5\' 11"  (1.803 m)   SpO2 100%   BMI 18.36 kg/m   HEMODYNAMICS:    VENTILATOR SETTINGS: Vent Mode: PRVC FiO2 (%):  [40 %] 40 % Set Rate:  [16 bmp] 16 bmp Vt Set:  [580 mL] 580 mL PEEP:  [5 cmH20] 5 cmH20 Plateau Pressure:  [14 cmH20-15 cmH20] 15 cmH20  INTAKE / OUTPUT: I/O last 3 completed shifts: In: 1641.4 [I.V.:1141.4; IV Piggyback:500] Out: 435 [Urine:435]  PHYSICAL EXAMINATION: General: This is a thin male who is orally intubated and not responsive to voice. Neuro: Exam is on 20 mcg/kg/min propofol.  There is no response to voice.  In response to sternal rub there is no eye opening.  He extends the right upper extremity and flexes the right lower extremity.  There is no movement on the left.  He has anisocoria with the right pupil being 5 mm in the left 2 mm Cardiovascular: S1 and S2 are regular and distant without murmur rub or gallop.  There is no JVD, there is no dependent edema, the feet are pink and warm.  Lungs: Respirations are unlabored, he is breathing above the set ventilator rate.  There is symmetric air movement and no wheezes. Abdomen: The abdomen is flat and soft without any overt organomegaly masses tenderness guarding or rebound.  LABS:  BMET Recent Labs  Lab 11/19/2017 1201 11/23/2017 1242 11/12/2017 1623 11/29/2017 2059 12/03/17 0305  NA 139  --  139 145 148*  K 4.0  --   --   --  3.6  CL 99*  --   --   --  117*  CO2 29  --   --   --  25  BUN 9  --   --   --  8  CREATININE 0.71 0.70  --   --  0.69  GLUCOSE 164*  --   --   --  115*    Electrolytes Recent Labs  Lab 11/04/2017 1201 12/03/17 0305  CALCIUM 7.9* 7.6*  MG  --  1.8  PHOS  --  2.6    CBC Recent Labs  Lab 11/27/2017 1058  12/03/17 0305  WBC 11.0* 10.5  HGB 17.2 14.9  HCT 52.0 46.0  PLT 293 173    Coag's Recent Labs  Lab 11/30/2017 1059  INR 1.05    Sepsis Markers Recent Labs  Lab 11/09/2017 1058 11/13/2017 1826  LATICACIDVEN 2.7* 1.7    ABG Recent Labs  Lab 11/22/2017 2040  PHART 7.458*  PCO2ART 39.8  PO2ART 99.0    Liver Enzymes Recent Labs  Lab 11/24/2017 1201 12/03/17 0305  AST 28 21  ALT 12* 11*  ALKPHOS 121 93  BILITOT 1.6* 1.0  ALBUMIN 3.4* 2.6*    Cardiac Enzymes Recent Labs  Lab 11/12/2017 1201  TROPONINI <0.03    Glucose Recent Labs  Lab 11/28/2017 1055 11/03/2017 1229  GLUCAP 159* 150*    Imaging Ct Angio Head W Or Wo Contrast  Addendum Date: 11/26/2017   ADDENDUM REPORT: 11/18/2017 14:00 ADDENDUM: Study discussed by telephone with Dr. Thad Ranger on 11/16/2017 at 1341 hours. Electronically Signed   By: Odessa Fleming M.D.   On: 11/06/2017 14:00   Result Date: 11/05/2017 CLINICAL DATA:  62 year old male with altered mental status. Fell at home. Code stroke. EXAM: CT ANGIOGRAPHY HEAD AND NECK CT PERFUSION BRAIN TECHNIQUE: Multidetector CT imaging of the head and neck was performed using the standard protocol during bolus administration of intravenous contrast. Multiplanar CT image reconstructions and MIPs were obtained to evaluate the vascular anatomy. Carotid stenosis measurements (when applicable) are obtained utilizing NASCET criteria, using the distal internal carotid diameter as the denominator. Multiphase CT imaging of the brain was performed following IV bolus contrast injection. Subsequent parametric perfusion maps were calculated using RAPID software. CONTRAST:  ISOVUE-370 IOPAMIDOL (ISOVUE-370) INJECTION 76% COMPARISON:  CT head and cervical spine at 1128 hours today. 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 No acute osseous abnormality identified. Upper chest: Centrilobular emphysema. No superior mediastinal lymphadenopathy. Other neck: Small volume retained secretions in the pharynx. Atrophied right submandibular gland. No neck mass or cervical lymphadenopathy. Aortic arch: 3 vessel arch configuration. Minimal arch atherosclerosis. Right carotid system: No brachiocephalic or right CCA origin stenosis. The right CCA is patent  up to the right carotid bifurcation. The right ECA remains patent, but there is occlusion of the right ICA beginning at its origin, with an expanded appearance of the right ICA origin and bulb fills with low-density thrombus (series 10, image 55). There is only faint enhancement of the ventral lumen to the level of the distal bulb. The right ICA remains occluded to the skull base. Left carotid system: Normal left CCA origin. Minimal soft and calcified plaque at the posterior left ICA origin. No cervical left ICA stenosis. Vertebral arteries: No proximal right subclavian artery stenosis, minor soft and calcified plaque. Normal right vertebral artery origin. Mildly non dominant right vertebral artery is patent to the skullbase without stenosis. No proximal left subclavian artery stenosis despite soft and calcified plaque. Normal left vertebral artery origin. Dominant left vertebral artery is patent to the skullbase without stenosis. CTA HEAD Posterior circulation: The non dominant distal right vertebral artery is patent to the vertebrobasilar system with an early, patent right PICA origin. Dominant distal left vertebral artery has a normal left PICA origin. Patent vertebrobasilar junction and basilar artery without stenosis. Normal SCA origins. Fetal type left PCA  origin. The right posterior communicating artery appears to be occluded anteriorly, but the right PCA remains within normal limits. See series 10, image 78. Bilateral PCA branches are within normal limits. Anterior circulation: Occluded right ICA siphon and right ICA terminus. The right MCA is occluded. The right ACA is occluded. Poor collaterals. There is only some posterior right MCA and ACA territory collateral enhancement. Left ICA siphon is patent without atherosclerosis or stenosis. Normal left ophthalmic and posterior communicating artery origins. Normal left MCA origin, but the left ACA A1 segment is thread-like, appearing diminutive and non dominant. Non the less, there is left ACA enhancement throughout. No left ACA branch occlusion is identified. The left MCA M1 segment, left MCA bifurcation, and left MCA branches are within normal limits. Venous sinuses: Patent on the delayed images. Anatomic variants: Non dominant and diminutive left ACA A1 segment. Fetal type left PCA. Dominant left vertebral artery. Delayed phase: Early cytotoxic edema is now evident in the right inferior frontal gyrus and right anterior temporal lobe as seen on series 6, image 12. Cytotoxic edema is now evident in the right ACA and anterior right MCA territories above the level of the ventricles on series 6, image 21. No intracranial mass effect. No acute intracranial hemorrhage identified. No ventriculomegaly. No abnormal parenchymal enhancement. Review of the MIP images confirms the above findings IMPRESSION: 1. Positive for Emergent Large Vessel Occlusion but CT Perfusion detects a large volume core infarct in the right MCA and ACA territories. Infarct volume estimated at 250 mL. 2. 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 3. The left ACA A1 segment is non-dominant and diminutive, and CTP suggests  superimposed left ACA/MCA watershed territory ischemia. 4. Cytotoxic edema now evident in the right MCA and right ACA territories. No associated hemorrhage. No intracranial mass effect at this time. 5. Little superimposed atherosclerosis in the head and neck. Therefore consider the possibility of cardiac or paradoxical thromboembolic disease. 6.  Emphysema (ICD10-J43.9). The findings of positive ELVO with large volume core infarct were communicated to Dr. Thad Ranger at 1:32 pmon 2017-12-03 by text page via the [facility] messaging system. Electronically Signed: By: Odessa Fleming M.D. On: 12/03/17 13:34   Ct Head Wo Contrast  Result Date: 12/03/2017  CLINICAL DATA:  62 y/o  M; cerebral edema. EXAM: CT HEAD WITHOUT CONTRAST TECHNIQUE: Contiguous axial images were obtained from the base of the skull through the vertex without intravenous contrast. COMPARISON:  11/11/2017 CT of the head. FINDINGS: Brain: Massive infarction involving right MCA, right ACA and left anterior ACA territories. Involved brain includes the right anterior and superior temporal lobe, near entirety of right frontal lobe, right basal ganglia and posterior limb of internal capsule, right anterior and lateral parietal lobe, left paramedian frontal lobe, left caudate head, and genu/ rostrum of corpus callosum. Edema results in mass effect with diffuse sulcal effacement, 11 mm right-to-left midline shift, effacement of the right lateral ventricle and right uncal herniation. Midbrain is displaced leftward with partial effacement of left quadrigeminal plate cistern. No downward herniation at this time. No acute hemorrhage identified. Vascular: Hyperdensity within right terminal ICA and proximal MCA, probable thrombus. Skull: Normal. Negative for fracture or focal lesion. Sinuses/Orbits: No acute finding. Other: Right intra-ocular lens replacement. IMPRESSION: 1. Massive infarct involving right MCA, right ACA, and left anterior ACA territories. No acute  hemorrhage. 2. Cerebral edema with diffuse sulcal effacement, 11 mm right-to-left midline shift, right lateral ventricle effacement, and right uncal herniation. No downward herniation at this time. 3. Hyperdensity within right terminal ICA and proximal MCA and, probable thrombus. Critical Value/emergent results were called by telephone at the time of interpretation on 12/03/2017 at 3:20 am to Dr. Caryl Pina , who verbally acknowledged these results. Electronically Signed   By: Mitzi Hansen M.D.   On: 12/03/2017 03:24   Ct Head Wo Contrast  Addendum Date: 11/02/2017   ADDENDUM REPORT: 11/08/2017 20:15 ADDENDUM: The patient had a CT perfusion study and CT angiogram performed earlier in the same day [facility]. The studies were listed as a separate patient on the PACS at the time interpretation. In retrospect, there is subtle loss of gray-white differentiation throughout the right frontal lobe and temporal tip. This corresponds with pseudo normalization of the large acute right MCA territory infarct. The abnormal area involves the right ACA distribution as well. In comparison with prior studies the extensive subtle loss of gray-white differentiation corresponds within an ASPECTS score of 5/10 accounting for points in the basal ganglia and M3 and M6 segments. These findings were discussed with Dr. Laurence Slate at 7:45 p.m. 11/07/2017. Electronically Signed   By: Marin Roberts M.D.   On: 12/01/2017 20:15   Result Date: 11/23/2017 CLINICAL DATA:  Code stroke.  TPA administered. EXAM: CT HEAD WITHOUT CONTRAST TECHNIQUE: Contiguous axial images were obtained from the base of the skull through the vertex without intravenous contrast. COMPARISON:  None. FINDINGS: Brain: Mild generalized volume loss. No evidence of old or acute focal infarction, mass lesion, hemorrhage, hydrocephalus or extra-axial collection. Vascular: Intravascular contrast is present. Major arterial and venous  structures show flow. Skull: Negative Sinuses/Orbits: Clear/normal Other: None IMPRESSION: Negative head CT. No acute finding. Aspects 10. There is intravenous iodinated contrast. These results were communicated to Dr. Laurence Slate At 3:23 pmon 12/13/2018by text page via the [facility] messaging system. Electronically Signed: By: Paulina Fusi M.D. On: 11/23/2017 15:25   Ct Head Wo Contrast  Result Date: 12/02/2017 CLINICAL DATA:  Altered level of consciousness EXAM: CT HEAD WITHOUT CONTRAST CT CERVICAL SPINE WITHOUT CONTRAST TECHNIQUE: Multidetector CT imaging of the head and cervical spine was performed following the standard protocol without intravenous contrast. Multiplanar CT image reconstructions of the cervical spine were also generated. COMPARISON:  None. FINDINGS: CT HEAD  FINDINGS Brain: No evidence of acute infarction, hemorrhage, hydrocephalus, extra-axial collection or mass lesion/mass effect. Cortical atrophy, frontal and cerebellar predominant. 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 Vascular: No hyperdense vessel or unexpected calcification. Skull: Normal. Negative for fracture or focal lesion. Sinuses/Orbits: The visualized paranasal sinuses are essentially clear. The mastoid air cells are unopacified. Other: None. CT CERVICAL SPINE FINDINGS Alignment: Straightening of the cervical spine, likely positional. Skull base and vertebrae: No acute fracture. No primary bone lesion or focal pathologic process. Soft tissues and spinal canal: No prevertebral fluid or swelling. No visible canal hematoma. Disc levels: Mild multilevel degenerative changes, most prominent at C6-7. Mild-to-moderate spinal canal narrowing at C5-6 due to calcification of the posterior longitudinal ligament (series 5/ image 53). Upper chest: Visualized lung apices are notable for mild centrilobular and paraseptal emphysematous changes. Other: Visualized thyroid is unremarkable. IMPRESSION: No evidence of acute intracranial abnormality.  Mild cortical atrophy. 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 No evidence of traumatic injury to the cervical spine. Mild degenerative changes. Mild to moderate spinal canal narrowing at C5-6 due to calcification of the posterior longitudinal ligament. Electronically Signed   By: Charline Bills M.D.   On: 12/18/2017 11:46   Ct Angio Neck W Or Wo Contrast  Addendum Date: 12/18/17   ADDENDUM REPORT: 18-Dec-2017 14:00 ADDENDUM: Study discussed by telephone with Dr. Thad Ranger on 2017/12/18 at 1341 hours. Electronically Signed   By: Odessa Fleming M.D.   On: 18-Dec-2017 14:00   Result Date: 18-Dec-2017 CLINICAL DATA:  62 year old male with altered mental status. Fell at home. Code stroke. EXAM: CT ANGIOGRAPHY HEAD AND NECK CT PERFUSION BRAIN TECHNIQUE: Multidetector CT imaging of the head and neck was performed using the standard protocol during bolus administration of intravenous contrast. Multiplanar CT image reconstructions and MIPs were obtained to evaluate the vascular anatomy. Carotid stenosis measurements (when applicable) are obtained utilizing NASCET criteria, using the distal internal carotid diameter as the denominator. Multiphase CT imaging of the brain was performed following IV bolus contrast injection. Subsequent parametric perfusion maps were calculated using RAPID software. CONTRAST:  ISOVUE-370 IOPAMIDOL (ISOVUE-370) INJECTION 76% COMPARISON:  CT head and cervical spine at 1128 hours today. 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 No acute osseous abnormality identified. Upper chest:  Centrilobular emphysema. No superior mediastinal lymphadenopathy. Other neck: Small volume retained secretions in the pharynx. Atrophied right submandibular gland. No neck mass or cervical lymphadenopathy. Aortic arch: 3 vessel arch configuration. Minimal arch atherosclerosis. Right carotid system: No brachiocephalic or right CCA origin stenosis. The right CCA is patent up to the right carotid bifurcation. The right ECA remains patent, but there is occlusion of the right ICA beginning at its origin, with an expanded appearance of the right ICA origin and bulb fills with low-density thrombus (series 10, image 55). There is only faint enhancement of the ventral lumen to the level of the distal bulb. The right ICA remains occluded to the skull base. Left carotid system: Normal left CCA origin. Minimal soft and calcified plaque at the posterior left ICA origin. No cervical left ICA stenosis. Vertebral arteries: No proximal right subclavian artery stenosis, minor soft and calcified plaque. Normal right vertebral  artery origin. Mildly non dominant right vertebral artery is patent to the skullbase without stenosis. No proximal left subclavian artery stenosis despite soft and calcified plaque. Normal left vertebral artery origin. Dominant left vertebral artery is patent to the skullbase without stenosis. CTA HEAD Posterior circulation: The non dominant distal right vertebral artery is patent to the vertebrobasilar system with an early, patent right PICA origin. Dominant distal left vertebral artery has a normal left PICA origin. Patent vertebrobasilar junction and basilar artery without stenosis. Normal SCA origins. Fetal type left PCA origin. The right posterior communicating artery appears to be occluded anteriorly, but the right PCA remains within normal limits. See series 10, image 78. Bilateral PCA branches are within normal limits. Anterior circulation: Occluded right ICA siphon and right ICA terminus. The right MCA  is occluded. The right ACA is occluded. Poor collaterals. There is only some posterior right MCA and ACA territory collateral enhancement. Left ICA siphon is patent without atherosclerosis or stenosis. Normal left ophthalmic and posterior communicating artery origins. Normal left MCA origin, but the left ACA A1 segment is thread-like, appearing diminutive and non dominant. Non the less, there is left ACA enhancement throughout. No left ACA branch occlusion is identified. The left MCA M1 segment, left MCA bifurcation, and left MCA branches are within normal limits. Venous sinuses: Patent on the delayed images. Anatomic variants: Non dominant and diminutive left ACA A1 segment. Fetal type left PCA. Dominant left vertebral artery. Delayed phase: Early cytotoxic edema is now evident in the right inferior frontal gyrus and right anterior temporal lobe as seen on series 6, image 12. Cytotoxic edema is now evident in the right ACA and anterior right MCA territories above the level of the ventricles on series 6, image 21. No intracranial mass effect. No acute intracranial hemorrhage identified. No ventriculomegaly. No abnormal parenchymal enhancement. Review of the MIP images confirms the above findings IMPRESSION: 1. Positive for Emergent Large Vessel Occlusion but CT Perfusion detects a large volume core infarct in the right MCA and ACA territories. Infarct volume estimated at 250 mL. 2. 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 3. The left ACA A1 segment is non-dominant and diminutive, and CTP suggests superimposed left ACA/MCA watershed territory ischemia. 4. Cytotoxic edema now evident in the right MCA and right ACA territories. No associated hemorrhage. No intracranial mass effect at this time. 5. Little superimposed atherosclerosis in the head and neck. Therefore consider the possibility of  cardiac or paradoxical thromboembolic disease. 6.  Emphysema (ICD10-J43.9). The findings of positive ELVO with large volume core infarct were communicated to Dr. Thad Ranger at 1:32 pmon 11/10/2017 by text page via the [facility] messaging system. Electronically Signed: By: Odessa Fleming M.D. On: 11/10/2017 13:34   Ct Cervical Spine Wo Contrast  Result Date: 11/21/2017 CLINICAL DATA:  Altered level of consciousness EXAM: CT HEAD WITHOUT CONTRAST CT CERVICAL SPINE WITHOUT CONTRAST TECHNIQUE: Multidetector CT imaging of the head and cervical spine was performed following the standard protocol without intravenous contrast. Multiplanar CT image reconstructions of the cervical spine were also generated. COMPARISON:  None. FINDINGS: CT HEAD FINDINGS Brain: No evidence of acute infarction, hemorrhage, hydrocephalus, extra-axial collection or mass lesion/mass effect. Cortical atrophy, frontal and cerebellar predominant. 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 Vascular: No hyperdense vessel or unexpected calcification. Skull: Normal. Negative for fracture or focal lesion. Sinuses/Orbits: The visualized paranasal sinuses are essentially clear. The mastoid  air cells are unopacified. Other: None. CT CERVICAL SPINE FINDINGS Alignment: Straightening of the cervical spine, likely positional. Skull base and vertebrae: No acute fracture. No primary bone lesion or focal pathologic process. Soft tissues and spinal canal: No prevertebral fluid or swelling. No visible canal hematoma. Disc levels: Mild multilevel degenerative changes, most prominent at C6-7. Mild-to-moderate spinal canal narrowing at C5-6 due to calcification of the posterior longitudinal ligament (series 5/ image 53). Upper chest: Visualized lung apices are notable for mild centrilobular and paraseptal emphysematous changes. Other: Visualized thyroid is unremarkable. IMPRESSION: No evidence of acute intracranial abnormality. Mild cortical atrophy. Old right external capsule  lacunar infarct. No evidence of traumatic injury to the cervical spine. Mild degenerative changes. Mild to moderate spinal canal narrowing at C5-6 due to calcification of the posterior longitudinal ligament. Electronically Signed   By: Charline Bills M.D.   On: 11/06/2017 11:46   Ct Cerebral Perfusion W Contrast  Addendum Date: 11/28/2017   ADDENDUM REPORT: 11/12/2017 14:00 ADDENDUM: Study discussed by telephone with Dr. Thad Ranger on 11/15/2017 at 1341 hours. Electronically Signed   By: Odessa Fleming M.D.   On: 12/01/2017 14:00   Result Date: 11/12/2017 CLINICAL DATA:  62 year old male with altered mental status. Fell at home. Code stroke. EXAM: CT ANGIOGRAPHY HEAD AND NECK CT PERFUSION BRAIN TECHNIQUE: Multidetector CT imaging of the head and neck was performed using the standard protocol during bolus administration of intravenous contrast. Multiplanar CT image reconstructions and MIPs were obtained to evaluate the vascular anatomy. Carotid stenosis measurements (when applicable) are obtained utilizing NASCET criteria, using the distal internal carotid diameter as the denominator. Multiphase CT imaging of the brain was performed following IV bolus contrast injection. Subsequent parametric perfusion maps were calculated using RAPID software. CONTRAST:  ISOVUE-370 IOPAMIDOL (ISOVUE-370) INJECTION 76% COMPARISON:  CT head and cervical spine at 1128 hours today. 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 No acute osseous abnormality identified. Upper chest: Centrilobular emphysema. No superior mediastinal  lymphadenopathy. Other neck: Small volume retained secretions in the pharynx. Atrophied right submandibular gland. No neck mass or cervical lymphadenopathy. Aortic arch: 3 vessel arch configuration. Minimal arch atherosclerosis. Right carotid system: No brachiocephalic or right CCA origin stenosis. The right CCA is patent up to the right carotid bifurcation. The right ECA remains patent, but there is occlusion of the right ICA beginning at its origin, with an expanded appearance of the right ICA origin and bulb fills with low-density thrombus (series 10, image 55). There is only faint enhancement of the ventral lumen to the level of the distal bulb. The right ICA remains occluded to the skull base. Left carotid system: Normal left CCA origin. Minimal soft and calcified plaque at the posterior left ICA origin. No cervical left ICA stenosis. Vertebral arteries: No proximal right subclavian artery stenosis, minor soft and calcified plaque. Normal right vertebral artery origin. Mildly non dominant right vertebral artery is patent to the skullbase without stenosis. No proximal left subclavian artery stenosis despite soft and calcified plaque. Normal left vertebral artery origin. Dominant left vertebral artery is patent to the skullbase without stenosis. CTA HEAD Posterior circulation: The non dominant distal right vertebral artery is  patent to the vertebrobasilar system with an early, patent right PICA origin. Dominant distal left vertebral artery has a normal left PICA origin. Patent vertebrobasilar junction and basilar artery without stenosis. Normal SCA origins. Fetal type left PCA origin. The right posterior communicating artery appears to be occluded anteriorly, but the right PCA remains within normal limits. See series 10, image 78. Bilateral PCA branches are within normal limits. Anterior circulation: Occluded right ICA siphon and right ICA terminus. The right MCA is occluded. The right ACA is occluded. Poor  collaterals. There is only some posterior right MCA and ACA territory collateral enhancement. Left ICA siphon is patent without atherosclerosis or stenosis. Normal left ophthalmic and posterior communicating artery origins. Normal left MCA origin, but the left ACA A1 segment is thread-like, appearing diminutive and non dominant. Non the less, there is left ACA enhancement throughout. No left ACA branch occlusion is identified. The left MCA M1 segment, left MCA bifurcation, and left MCA branches are within normal limits. Venous sinuses: Patent on the delayed images. Anatomic variants: Non dominant and diminutive left ACA A1 segment. Fetal type left PCA. Dominant left vertebral artery. Delayed phase: Early cytotoxic edema is now evident in the right inferior frontal gyrus and right anterior temporal lobe as seen on series 6, image 12. Cytotoxic edema is now evident in the right ACA and anterior right MCA territories above the level of the ventricles on series 6, image 21. No intracranial mass effect. No acute intracranial hemorrhage identified. No ventriculomegaly. No abnormal parenchymal enhancement. Review of the MIP images confirms the above findings IMPRESSION: 1. Positive for Emergent Large Vessel Occlusion but CT Perfusion detects a large volume core infarct in the right MCA and ACA territories. Infarct volume estimated at 250 mL. 2. 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 3. The left ACA A1 segment is non-dominant and diminutive, and CTP suggests superimposed left ACA/MCA watershed territory ischemia. 4. Cytotoxic edema now evident in the right MCA and right ACA territories. No associated hemorrhage. No intracranial mass effect at this time. 5. Little superimposed atherosclerosis in the head and neck. Therefore consider the possibility of cardiac or paradoxical thromboembolic  disease. 6.  Emphysema (ICD10-J43.9). The findings of positive ELVO with large volume core infarct were communicated to Dr. Thad Ranger at 1:32 pmon 11/28/2017 by text page via the [facility] messaging system. Electronically Signed: By: Odessa Fleming M.D. On: 11/24/2017 13:34   Dg Chest Port 1 View  Result Date: 11/26/2017 CLINICAL DATA:  Status post intubation EXAM: PORTABLE CHEST 1 VIEW COMPARISON:  None. FINDINGS: Endotracheal tube is noted in satisfactory position. The lungs are well aerated bilaterally. No focal infiltrate or sizable effusion is seen. The cardiac shadow is within normal limits. Under changes in the proximal left humerus are noted. IMPRESSION: Endotracheal tube in satisfactory position. Electronically Signed   By: Alcide Clever M.D.   On: 11/09/2017 15:51   Dg Chest Portable 1 View  Result Date: 11/28/2017 CLINICAL DATA:  Patient fell at home.  No reported chest complaints. EXAM: PORTABLE CHEST 1 VIEW COMPARISON:  None in PACs FINDINGS: The lungs are well-expanded and clear. The heart and pulmonary vascularity are normal. The mediastinum is normal in width. There is no pleural effusion. The observed bony thorax is normal. There is chronic deformity of the left humeral head and proximal shaft. IMPRESSION: There is no active cardiopulmonary disease. Electronically Signed  By: David  SwazilandJordan M.D.   On: 12/01/2017 12:06   Dg Abd Portable 1v  Result Date: 11/09/2017 CLINICAL DATA:  OG tube placement. EXAM: PORTABLE ABDOMEN - 1 VIEW COMPARISON:  None. FINDINGS: Enteric tube in place with the tip near the pylorus. Nonobstructive bowel gas pattern. Medullary nephrocalcinosis. IMPRESSION: 1. Appropriately positioned enteric tube. 2. Medullary nephrocalcinosis. Electronically Signed   By: Obie DredgeWilliam T Derry M.D.   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  ASSESSMENT / PLAN:  PULMONARY A: He remains intubated primarily for airway protection.  NEUROLOGIC A: Family is aware of the evolution of his infarct and do not wish to consider craniectomy.  I will be discussing the anticipated severe neurological deficits associated with his stroke with family when they arrive and formulate a plan of care in keeping with the patient's wishes.  32 minutes of direct patient care has been provided today  Penny PiaWJ Gray, MD Pulmonary and Critical Care Medicine [facility] Pager: [phone]  12/03/2017, 7:44 AM

## 2017-12-03 NOTE — Progress Notes (Signed)
OT Cancellation Note  Patient Details Name: George Little MRN: [id number] DOB: [birth year]   Cancelled Treatment:    Reason Eval/Treat Not Completed: Patient not medically ready;Medical issues which prohibited therapy. Pt with active bedrest orders and awaiting palliative consult. Will await increase in activity orders prior to initiating OT evaluation. Thank you for this referral!  Doristine Sectionharity A Kilah Drahos, MS OTR/L  Pager: [phone]    [facility] HospitalCharity A Ranier Coach 12/03/2017, 7:46 AM

## 2017-12-03 NOTE — Progress Notes (Signed)
Family has had the opportunity to convene and discussed the current situation.  They are aware that the best possible outcome would be survival with a severe neurologic.  All family members including the patient's wife are convinced that he would not wish to survive in such circumstances and they have asked that I withdraw current supportive care and provide comfort measures only.  I am complying with their wishes.

## 2017-12-03 NOTE — Progress Notes (Signed)
CDS called and updated on status of patient and that family is ready to withdrawal care at this time. Per coordinator, patient does not meet criteria as they anticipate he will not pass soon after being extubated. Will call CDS with cardiac time of death as directed.

## 2017-12-03 NOTE — Progress Notes (Signed)
SLP Cancellation Note  Patient Details Name: George Little MRN: [id number] DOB: Feb 27, 1956   Cancelled treatment:       Reason Eval/Treat Not Completed: Patient not medically ready   Ellah Otte, Riley NearingBonnie Caroline 12/03/2017, 11:04 AM

## 2017-12-03 NOTE — Progress Notes (Signed)
RT extubated patient as ordered by MD and as requested by family. Patient now comfort care at this time with morphine gtt infusing. Will continue to monitor patient and assess family needs.

## 2017-12-03 NOTE — Procedures (Signed)
Extubation Procedure Note  Patient Details:   Name: George Little DOB: [birth year] MRN: [id number]   Airway Documentation:     Evaluation   Pt terminally extubated per MD order. RT will continue to monitor.   Armando GangMike, Marshia Tropea C 12/03/2017, 9:30 PM

## 2017-12-03 NOTE — Progress Notes (Signed)
PT Cancellation Note  Patient Details Name: George Little MRN: [id number] DOB: 1956/10/02   Cancelled Treatment:    Reason Eval/Treat Not Completed: Medical issues which prohibited therapy(pt on strict bedrest s/p tPA with plan for palliative intervention not surgical. )   [name] B Lun Muro 12/03/2017, 7:10 AM  Delaney MeigsMaija Tabor Yanessa Hocevar, PT [phone]

## 2017-12-03 NOTE — Progress Notes (Signed)
STROKE TEAM PROGRESS NOTE   SUBJECTIVE (INTERVAL HISTORY) His wife is at the bedside.  Patient intubated, right pupil dilated and fixed, lack of movement on the left, not responding to voice.  Repeat CT showed right MCA, ACA and left ACA infarcts with midline shift about 1 cm. I had long discussion with wife at bedside, updated pt current condition, treatment option and poor prognosis. She stated that pt would not want any trach, PEG, nursing home, and wish to die in dignity.  Wife will discuss with son and let us know the timing for one-way extubation.    OBJECTIVE Temp:  [98 F (36.7 C)-100.9 F (38.3 C)] 99.8 F (37.7 C) (01/01 0800) Pulse Rate:  [52-105] 69 (01/01 1000) Cardiac Rhythm: Normal sinus rhythm (01/01 1000) Resp:  [16-26] 25 (01/01 1000) BP: (103-151)/(68-97) 119/94 (01/01 1000) SpO2:  [94 %-100 %] 100 % (01/01 1000) FiO2 (%):  [40 %] 40 % (01/01 1000) Weight:  [131 lb 9.8 oz (59.7 kg)] 131 lb 9.8 oz (59.7 kg) (12/31 1321)  Recent Labs  Lab 01-01-2018 1055 2018/01/01 1229  GLUCAP 159* 150*   Recent Labs  Lab 01-Jan-2018 1201 01-01-2018 1242 01-01-2018 1623 2018-01-01 2059 12/03/17 0305 12/03/17 0832  NA 139  --  139 145 148* 152*  K 4.0  --   --   --  3.6  --   CL 99*  --   --   --  117*  --   CO2 29  --   --   --  25  --   GLUCOSE 164*  --   --   --  115*  --   BUN 9  --   --   --  8  --   CREATININE 0.71 0.70  --   --  0.69  --   CALCIUM 7.9*  --   --   --  7.6*  --   MG  --   --   --   --  1.8  --   PHOS  --   --   --   --  2.6  --    Recent Labs  Lab 01-Jan-2018 1201 12/03/17 0305  AST 28 21  ALT 12* 11*  ALKPHOS 121 93  BILITOT 1.6* 1.0  PROT 6.9 5.5*  ALBUMIN 3.4* 2.6*   Recent Labs  Lab 01/01/18 1058 12/03/17 0305  WBC 11.0* 10.5  NEUTROABS 9.0* 8.5*  HGB 17.2 14.9  HCT 52.0 46.0  MCV 89.4 93.5  PLT 293 173   Recent Labs  Lab 01/01/18 1201  TROPONINI <0.03   Recent Labs    2018-01-01 1059  LABPROT 13.6  INR 1.05   No results for  input(s): COLORURINE, LABSPEC, PHURINE, GLUCOSEU, HGBUR, BILIRUBINUR, KETONESUR, PROTEINUR, UROBILINOGEN, NITRITE, LEUKOCYTESUR in the last 72 hours.  Invalid input(s): APPERANCEUR     Component Value Date/Time   CHOL 96 12/03/2017 0305   TRIG 70 12/03/2017 0305   HDL 33 (L) 12/03/2017 0305   CHOLHDL 2.9 12/03/2017 0305   VLDL 14 12/03/2017 0305   LDLCALC 49 12/03/2017 0305   Lab Results  Component Value Date   HGBA1C 5.0 12/03/2017      Component Value Date/Time   LABOPIA NONE DETECTED 01/01/2018 1059   COCAINSCRNUR NONE DETECTED 01-01-2018 1059   LABBENZ NONE DETECTED January 01, 2018 1059   AMPHETMU NONE DETECTED 2018-01-01 1059   THCU POSITIVE (A) 01/01/18 1059   LABBARB NONE DETECTED 01-01-2018 1059    Recent Labs  Lab  Dec 25, 2017 1058  ETH <10    I have personally reviewed the radiological images below and agree with the radiology interpretations.  Ct Angio Head W Or Wo Contrast  Addendum Date: Dec 25, 2017   ADDENDUM REPORT: 12-25-2017 14:00 ADDENDUM: Study discussed by telephone with Dr. Thad Ranger on Dec 25, 2017 at 1341 hours. Electronically Signed   By: Odessa Fleming M.D.   On: 2017/12/25 14:00   Result Date: December 25, 2017 CLINICAL DATA:  62 year old male with altered mental status. Fell at home. Code stroke. EXAM: CT ANGIOGRAPHY HEAD AND NECK CT PERFUSION BRAIN TECHNIQUE: Multidetector CT imaging of the head and neck was performed using the standard protocol during bolus administration of intravenous contrast. Multiplanar CT image reconstructions and MIPs were obtained to evaluate the vascular anatomy. Carotid stenosis measurements (when applicable) are obtained utilizing NASCET criteria, using the distal internal carotid diameter as the denominator. Multiphase CT imaging of the brain was performed following IV bolus contrast injection. Subsequent parametric perfusion maps were calculated using RAPID software. CONTRAST:  ISOVUE-370 IOPAMIDOL (ISOVUE-370) INJECTION 76% COMPARISON:   CT head and cervical spine at 1128 hours today. 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 No acute osseous abnormality identified. Upper chest: Centrilobular emphysema. No superior mediastinal lymphadenopathy. Other neck: Small volume retained secretions in the pharynx. Atrophied right submandibular gland. No neck mass or cervical lymphadenopathy. Aortic arch: 3 vessel arch configuration. Minimal arch atherosclerosis. Right carotid system: No brachiocephalic or right CCA origin stenosis. The right CCA is patent up to the right carotid bifurcation. The right ECA remains patent, but there is occlusion of the right ICA beginning at its origin, with an expanded appearance of the right ICA origin and bulb fills with low-density thrombus (series 10, image 55). There is only faint enhancement of the ventral lumen to the level of the distal bulb. The right ICA remains occluded to the skull base. Left carotid system: Normal left CCA origin. Minimal soft and calcified plaque at the posterior left ICA origin. No cervical left ICA stenosis. Vertebral arteries: No proximal right subclavian artery stenosis, minor soft and calcified plaque. Normal right vertebral artery origin. Mildly non dominant right vertebral artery is patent to the skullbase without stenosis. No proximal left subclavian artery stenosis despite soft and calcified plaque. Normal left vertebral artery origin. Dominant left vertebral artery is patent to the skullbase without stenosis. CTA HEAD Posterior circulation: The non dominant distal right vertebral artery is  patent to the vertebrobasilar system with an early, patent right PICA origin. Dominant distal left vertebral artery has a normal left PICA origin. Patent vertebrobasilar junction and basilar artery without stenosis. Normal SCA origins. Fetal type left PCA origin. The right posterior communicating artery appears to be occluded anteriorly, but the right PCA remains within normal limits. See series 10, image 78. Bilateral PCA branches are within normal limits. Anterior circulation: Occluded right ICA siphon and right ICA terminus. The right MCA is occluded. The right ACA is occluded. Poor collaterals. There is only some posterior right MCA and ACA territory collateral enhancement. Left ICA siphon is patent without atherosclerosis or stenosis. Normal left ophthalmic and posterior communicating artery origins. Normal left MCA origin, but the left ACA A1  segment is thread-like, appearing diminutive and non dominant. Non the less, there is left ACA enhancement throughout. No left ACA branch occlusion is identified. The left MCA M1 segment, left MCA bifurcation, and left MCA branches are within normal limits. Venous sinuses: Patent on the delayed images. Anatomic variants: Non dominant and diminutive left ACA A1 segment. Fetal type left PCA. Dominant left vertebral artery. Delayed phase: Early cytotoxic edema is now evident in the right inferior frontal gyrus and right anterior temporal lobe as seen on series 6, image 12. Cytotoxic edema is now evident in the right ACA and anterior right MCA territories above the level of the ventricles on series 6, image 21. No intracranial mass effect. No acute intracranial hemorrhage identified. No ventriculomegaly. No abnormal parenchymal enhancement. Review of the MIP images confirms the above findings IMPRESSION: 1. Positive for Emergent Large Vessel Occlusion but CT Perfusion detects a large volume core infarct in the right MCA and ACA territories. Infarct volume estimated at 250 mL.  2. 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 3. The left ACA A1 segment is non-dominant and diminutive, and CTP suggests superimposed left ACA/MCA watershed territory ischemia. 4. Cytotoxic edema now evident in the right MCA and right ACA territories. No associated hemorrhage. No intracranial mass effect at this time. 5. Little superimposed atherosclerosis in the head and neck. Therefore consider the possibility of cardiac or paradoxical thromboembolic disease. 6.  Emphysema (ICD10-J43.9). The findings of positive ELVO with large volume core infarct were communicated to Dr. Thad Rangereynolds at 1:32 pmon 11/25/2017 by text page via the [facility] messaging system. Electronically Signed: By: Odessa FlemingH  Hall M.D. On: 11/04/2017 13:34   Ct Head Wo Contrast  Result Date: 12/03/2017 CLINICAL DATA:  62 y/o  M; cerebral edema. EXAM: CT HEAD WITHOUT CONTRAST TECHNIQUE: Contiguous axial images were obtained from the base of the skull through the vertex without intravenous contrast. COMPARISON:  11/14/2017 CT of the head. FINDINGS: Brain: Massive infarction involving right MCA, right ACA and left anterior ACA territories. Involved brain includes the right anterior and superior temporal lobe, near entirety of right frontal lobe, right basal ganglia and posterior limb of internal capsule, right anterior and lateral parietal lobe, left paramedian frontal lobe, left caudate head, and genu/ rostrum of corpus callosum. Edema results in mass effect with diffuse sulcal effacement, 11 mm right-to-left midline shift, effacement of the right lateral ventricle and right uncal herniation. Midbrain is displaced leftward with partial effacement of left quadrigeminal plate cistern. No downward herniation at this time. No acute hemorrhage identified. Vascular: Hyperdensity within right terminal ICA and proximal MCA, probable thrombus.  Skull: Normal. Negative for fracture or focal lesion. Sinuses/Orbits: No acute finding. Other: Right intra-ocular lens replacement. IMPRESSION: 1. Massive infarct involving right MCA, right ACA, and left anterior ACA territories. No acute hemorrhage. 2. Cerebral edema with diffuse sulcal effacement, 11 mm right-to-left midline shift, right lateral ventricle effacement, and right uncal herniation. No downward herniation at this time. 3. Hyperdensity within right terminal ICA and proximal MCA and, probable thrombus. Critical Value/emergent results were called by telephone at the time of interpretation on 12/03/2017 at 3:20 am to Dr. Caryl PinaERIC LINDZEN , who verbally acknowledged these results. Electronically Signed   By: Mitzi HansenLance  Furusawa-Stratton M.D.   On: 12/03/2017 03:24   Ct Head Wo Contrast  Addendum Date: 11/10/2017   ADDENDUM REPORT: 11/02/2017 20:15 ADDENDUM: The patient had a CT  perfusion study and CT angiogram performed earlier in the same day [facility]. The studies were listed as a separate patient on the PACS at the time interpretation. In retrospect, there is subtle loss of gray-white differentiation throughout the right frontal lobe and temporal tip. This corresponds with pseudo normalization of the large acute right MCA territory infarct. The abnormal area involves the right ACA distribution as well. In comparison with prior studies the extensive subtle loss of gray-white differentiation corresponds within an ASPECTS score of 5/10 accounting for points in the basal ganglia and M3 and M6 segments. These findings were discussed with Dr. Laurence Slate at 7:45 p.m. 01/02/2018. Electronically Signed   By: Marin Roberts M.D.   On: 12/16/2017 20:15   Result Date: 12/29/2017 CLINICAL DATA:  Code stroke.  TPA administered. EXAM: CT HEAD WITHOUT CONTRAST TECHNIQUE: Contiguous axial images were obtained from the base of the skull through the vertex without intravenous contrast. COMPARISON:   None. FINDINGS: Brain: Mild generalized volume loss. No evidence of old or acute focal infarction, mass lesion, hemorrhage, hydrocephalus or extra-axial collection. Vascular: Intravascular contrast is present. Major arterial and venous structures show flow. Skull: Negative Sinuses/Orbits: Clear/normal Other: None IMPRESSION: Negative head CT. No acute finding. Aspects 10. There is intravenous iodinated contrast. These results were communicated to Dr. Laurence Slate At 3:23 pmon 01/22/2019by text page via the [facility] messaging system. Electronically Signed: By: Paulina Fusi M.D. On: 12/08/2017 15:25   Ct Head Wo Contrast  Result Date: 12/07/2017 CLINICAL DATA:  Altered level of consciousness EXAM: CT HEAD WITHOUT CONTRAST CT CERVICAL SPINE WITHOUT CONTRAST TECHNIQUE: Multidetector CT imaging of the head and cervical spine was performed following the standard protocol without intravenous contrast. Multiplanar CT image reconstructions of the cervical spine were also generated. COMPARISON:  None. FINDINGS: CT HEAD FINDINGS Brain: No evidence of acute infarction, hemorrhage, hydrocephalus, extra-axial collection or mass lesion/mass effect. Cortical atrophy, frontal and cerebellar predominant. 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 Vascular: No hyperdense vessel or unexpected calcification. Skull: Normal. Negative for fracture or focal lesion. Sinuses/Orbits: The visualized paranasal sinuses are essentially clear. The mastoid air cells are unopacified. Other: None. CT CERVICAL SPINE FINDINGS Alignment: Straightening of the cervical spine, likely positional. Skull base and vertebrae: No acute fracture. No primary bone lesion or focal pathologic process. Soft tissues and spinal canal: No prevertebral fluid or swelling. No visible canal hematoma. Disc levels: Mild multilevel degenerative changes, most prominent at C6-7. Mild-to-moderate spinal canal narrowing at C5-6 due to calcification of the posterior longitudinal ligament  (series 5/ image 53). Upper chest: Visualized lung apices are notable for mild centrilobular and paraseptal emphysematous changes. Other: Visualized thyroid is unremarkable. IMPRESSION: No evidence of acute intracranial abnormality. Mild cortical atrophy. 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 No evidence of traumatic injury to the cervical spine. Mild degenerative changes. Mild to moderate spinal canal narrowing at C5-6 due to calcification of the posterior longitudinal ligament. Electronically Signed   By: Charline Bills M.D.   On: December 04, 2017 11:46   Ct Angio Neck W Or Wo Contrast  Addendum Date: Dec 04, 2017   ADDENDUM REPORT: 12/25/2017 14:00 ADDENDUM: Study discussed by telephone with Dr. Thad Ranger on Dec 04, 2017 at 1341 hours. Electronically Signed   By: Odessa Fleming M.D.   On: 12/31/2017 14:00   Result Date: December 04, 2017 CLINICAL DATA:  62 year old male with altered mental status. Fell at home. Code stroke. EXAM: CT ANGIOGRAPHY HEAD AND NECK CT PERFUSION BRAIN TECHNIQUE: Multidetector CT imaging of the head and neck was performed  using the standard protocol during bolus administration of intravenous contrast. Multiplanar CT image reconstructions and MIPs were obtained to evaluate the vascular anatomy. Carotid stenosis measurements (when applicable) are obtained utilizing NASCET criteria, using the distal internal carotid diameter as the denominator. Multiphase CT imaging of the brain was performed following IV bolus contrast injection. Subsequent parametric perfusion maps were calculated using RAPID software. CONTRAST:  ISOVUE-370 IOPAMIDOL (ISOVUE-370) INJECTION 76% COMPARISON:  CT head and cervical spine at 1128 hours today. 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 No acute osseous abnormality identified. Upper chest: Centrilobular emphysema. No superior mediastinal lymphadenopathy. Other neck: Small volume retained secretions in the pharynx. Atrophied right submandibular gland. No neck mass or cervical lymphadenopathy. Aortic arch: 3 vessel arch configuration. Minimal arch atherosclerosis. Right carotid system: No brachiocephalic or right CCA origin stenosis. The right CCA is patent up to the right carotid bifurcation. The right ECA remains patent, but there is occlusion of the right ICA beginning at its origin, with an expanded appearance of the right ICA origin and bulb fills with low-density thrombus (series 10, image 55). There is only faint enhancement of the ventral lumen to the level of the distal bulb. The right ICA remains occluded to the skull base. Left carotid system: Normal left CCA origin. Minimal soft and calcified plaque at the posterior left ICA origin. No cervical left ICA stenosis. Vertebral arteries: No proximal right subclavian artery stenosis, minor soft and calcified plaque. Normal right vertebral artery origin. Mildly non dominant right vertebral artery is patent to the skullbase without stenosis. No proximal left subclavian artery stenosis despite soft and calcified plaque. Normal left vertebral artery origin. Dominant left vertebral artery is patent to the skullbase without stenosis. CTA HEAD Posterior circulation: The non dominant distal right vertebral artery is patent to the vertebrobasilar system with an early, patent right PICA origin. Dominant distal left vertebral artery has a normal left PICA origin. Patent vertebrobasilar junction and basilar artery without stenosis. Normal SCA origins. Fetal type left PCA origin. The right posterior communicating artery appears to be  occluded anteriorly, but the right PCA remains within normal limits. See series 10, image 78. Bilateral PCA branches are within normal limits. Anterior circulation: Occluded right ICA siphon and right ICA terminus. The right MCA is occluded. The right ACA is occluded. Poor collaterals. There is only some posterior right MCA and ACA territory collateral enhancement. Left ICA siphon is patent without atherosclerosis or stenosis. Normal left ophthalmic and posterior communicating artery origins. Normal left MCA origin, but the left ACA A1 segment is thread-like, appearing diminutive and non dominant. Non the less, there is left ACA enhancement throughout. No left ACA branch occlusion is identified. The left MCA M1 segment, left MCA bifurcation, and left MCA branches are within normal limits. Venous sinuses: Patent on the delayed images. Anatomic variants: Non dominant and diminutive left ACA A1 segment. Fetal type left PCA. Dominant left vertebral artery. Delayed phase: Early cytotoxic edema is now evident in the right inferior frontal gyrus and right anterior temporal lobe as seen on series 6, image 12. Cytotoxic edema is now evident in the right ACA and anterior right MCA territories above  the level of the ventricles on series 6, image 21. No intracranial mass effect. No acute intracranial hemorrhage identified. No ventriculomegaly. No abnormal parenchymal enhancement. Review of the MIP images confirms the above findings IMPRESSION: 1. Positive for Emergent Large Vessel Occlusion but CT Perfusion detects a large volume core infarct in the right MCA and ACA territories. Infarct volume estimated at 250 mL. 2. 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 3. The left ACA A1 segment is non-dominant and diminutive, and CTP suggests superimposed left ACA/MCA watershed territory ischemia. 4. Cytotoxic  edema now evident in the right MCA and right ACA territories. No associated hemorrhage. No intracranial mass effect at this time. 5. Little superimposed atherosclerosis in the head and neck. Therefore consider the possibility of cardiac or paradoxical thromboembolic disease. 6.  Emphysema (ICD10-J43.9). The findings of positive ELVO with large volume core infarct were communicated to Dr. Thad Ranger at 1:32 pmon 11/08/2017 by text page via the [facility] messaging system. Electronically Signed: By: Odessa Fleming M.D. On: 11/10/2017 13:34   Ct Cervical Spine Wo Contrast  Result Date: 11/10/2017 CLINICAL DATA:  Altered level of consciousness EXAM: CT HEAD WITHOUT CONTRAST CT CERVICAL SPINE WITHOUT CONTRAST TECHNIQUE: Multidetector CT imaging of the head and cervical spine was performed following the standard protocol without intravenous contrast. Multiplanar CT image reconstructions of the cervical spine were also generated. COMPARISON:  None. FINDINGS: CT HEAD FINDINGS Brain: No evidence of acute infarction, hemorrhage, hydrocephalus, extra-axial collection or mass lesion/mass effect. Cortical atrophy, frontal and cerebellar predominant. 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 Vascular: No hyperdense vessel or unexpected calcification. Skull: Normal. Negative for fracture or focal lesion. Sinuses/Orbits: The visualized paranasal sinuses are essentially clear. The mastoid air cells are unopacified. Other: None. CT CERVICAL SPINE FINDINGS Alignment: Straightening of the cervical spine, likely positional. Skull base and vertebrae: No acute fracture. No primary bone lesion or focal pathologic process. Soft tissues and spinal canal: No prevertebral fluid or swelling. No visible canal hematoma. Disc levels: Mild multilevel degenerative changes, most prominent at C6-7. Mild-to-moderate spinal canal narrowing at C5-6 due to calcification of the posterior longitudinal ligament (series 5/ image 53). Upper chest: Visualized lung  apices are notable for mild centrilobular and paraseptal emphysematous changes. Other: Visualized thyroid is unremarkable. IMPRESSION: No evidence of acute intracranial abnormality. Mild cortical atrophy. 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 No evidence of traumatic injury to the cervical spine. Mild degenerative changes. Mild to moderate spinal canal narrowing at C5-6 due to calcification of the posterior longitudinal ligament. Electronically Signed   By: Charline Bills M.D.   On: 11/10/2017 11:46   Ct Cerebral Perfusion W Contrast  Addendum Date: 11/10/2017   ADDENDUM REPORT: 11/12/2017 14:00 ADDENDUM: Study discussed by telephone with Dr. Thad Ranger on 11/02/2017 at 1341 hours. Electronically Signed   By: Odessa Fleming M.D.   On: 11/05/2017 14:00   Result Date: 11/16/2017 CLINICAL DATA:  62 year old male with altered mental status. Fell at home. Code stroke. EXAM: CT ANGIOGRAPHY HEAD AND NECK CT PERFUSION BRAIN TECHNIQUE: Multidetector CT imaging of the head and neck was performed using the standard protocol during bolus administration of intravenous contrast. Multiplanar CT image reconstructions and MIPs were obtained to evaluate the vascular anatomy. Carotid stenosis measurements (when applicable) are obtained utilizing NASCET criteria, using the distal internal carotid diameter as the denominator. Multiphase CT imaging of the brain was performed following IV bolus contrast  injection. Subsequent parametric perfusion maps were calculated using RAPID software. CONTRAST:  ISOVUE-370 IOPAMIDOL (ISOVUE-370) INJECTION 76% COMPARISON:  CT head and cervical spine at 1128 hours today. 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 No acute osseous abnormality identified. Upper chest: Centrilobular emphysema. No superior mediastinal lymphadenopathy. Other neck: Small volume retained secretions in the pharynx. Atrophied right submandibular gland. No neck mass or cervical lymphadenopathy. Aortic arch: 3 vessel arch configuration. Minimal arch atherosclerosis. Right carotid system: No brachiocephalic or right CCA origin stenosis. The right CCA is patent up to the right carotid bifurcation. The right ECA remains patent, but there is occlusion of the right ICA beginning at its origin, with an expanded appearance of the right ICA origin and bulb fills with low-density thrombus (series 10, image 55). There is only faint enhancement of the ventral lumen to the level of the distal bulb. The right ICA remains occluded to the skull base. Left carotid system: Normal left CCA origin. Minimal soft and calcified plaque at the posterior left ICA origin. No cervical left ICA stenosis. Vertebral arteries: No proximal right subclavian artery stenosis, minor soft and calcified plaque. Normal right vertebral artery origin. Mildly non dominant right vertebral artery is patent to the skullbase without stenosis. No proximal left subclavian artery stenosis despite soft and calcified plaque. Normal left vertebral artery origin. Dominant left vertebral artery is patent to the skullbase without stenosis. CTA HEAD Posterior circulation: The non dominant distal right vertebral artery is patent to the vertebrobasilar system with an early, patent right PICA origin. Dominant distal left vertebral artery has a normal left PICA origin. Patent vertebrobasilar junction and basilar artery without stenosis. Normal SCA origins. Fetal type left PCA origin. The right posterior communicating artery appears to be occluded anteriorly, but the right PCA remains within  normal limits. See series 10, image 78. Bilateral PCA branches are within normal limits. Anterior circulation: Occluded right ICA siphon and right ICA terminus. The right MCA is occluded. The right ACA is occluded. Poor collaterals. There is only some posterior right MCA and ACA territory collateral enhancement. Left ICA siphon is patent without atherosclerosis or stenosis. Normal left ophthalmic and posterior communicating artery origins. Normal left MCA origin, but the left ACA A1 segment is thread-like, appearing diminutive and non dominant. Non the less, there is left ACA enhancement throughout. No left ACA branch occlusion is identified. The left MCA M1 segment, left MCA bifurcation, and left MCA branches are within normal limits. Venous sinuses: Patent on the delayed images. Anatomic variants: Non dominant and diminutive left ACA A1 segment. Fetal type left PCA. Dominant left vertebral artery. Delayed phase: Early cytotoxic edema is now evident in the right inferior frontal gyrus and right anterior temporal lobe as seen on series 6, image 12. Cytotoxic edema is now evident in the right ACA and anterior right MCA territories above the level of the ventricles on series 6, image 21. No intracranial mass effect. No acute intracranial hemorrhage identified. No ventriculomegaly. No abnormal parenchymal enhancement. Review of the MIP images confirms the above findings IMPRESSION: 1. Positive for Emergent Large Vessel Occlusion but CT Perfusion detects a large volume core infarct in the right  MCA and ACA territories. Infarct volume estimated at 250 mL. 2. 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 3. The left ACA A1 segment is non-dominant and diminutive, and CTP suggests superimposed left ACA/MCA watershed territory ischemia. 4. Cytotoxic edema now evident in the right MCA and right ACA  territories. No associated hemorrhage. No intracranial mass effect at this time. 5. Little superimposed atherosclerosis in the head and neck. Therefore consider the possibility of cardiac or paradoxical thromboembolic disease. 6.  Emphysema (ICD10-J43.9). The findings of positive ELVO with large volume core infarct were communicated to Dr. Thad Ranger at 1:32 pmon 12-16-2017 by text page via the [facility] messaging system. Electronically Signed: By: Odessa Fleming M.D. On: 12-16-2017 13:34   Dg Chest Port 1 View  Result Date: Dec 16, 2017 CLINICAL DATA:  Status post intubation EXAM: PORTABLE CHEST 1 VIEW COMPARISON:  None. FINDINGS: Endotracheal tube is noted in satisfactory position. The lungs are well aerated bilaterally. No focal infiltrate or sizable effusion is seen. The cardiac shadow is within normal limits. Under changes in the proximal left humerus are noted. IMPRESSION: Endotracheal tube in satisfactory position. Electronically Signed   By: Alcide Clever M.D.   On: December 16, 2017 15:51   Dg Chest Portable 1 View  Result Date: 12-16-2017 CLINICAL DATA:  Patient fell at home.  No reported chest complaints. EXAM: PORTABLE CHEST 1 VIEW COMPARISON:  None in PACs FINDINGS: The lungs are well-expanded and clear. The heart and pulmonary vascularity are normal. The mediastinum is normal in width. There is no pleural effusion. The observed bony thorax is normal. There is chronic deformity of the left humeral head and proximal shaft. IMPRESSION: There is no active cardiopulmonary disease. Electronically Signed   By: David  [place] M.D.   On: 16-Dec-2017 12:06   Dg Abd Portable 1v  Result Date: 2017-12-16 CLINICAL DATA:  OG tube placement. EXAM: PORTABLE ABDOMEN - 1 VIEW COMPARISON:  None. FINDINGS: Enteric tube in place with the tip near the pylorus. Nonobstructive bowel gas pattern. Medullary nephrocalcinosis. IMPRESSION: 1. Appropriately positioned enteric tube. 2. Medullary nephrocalcinosis. Electronically Signed   By:  Obie Dredge M.D.   On: Dec 16, 2017 20:09    PHYSICAL EXAM  Temp:  [98 F (36.7 C)-100.9 F (38.3 C)] 99.8 F (37.7 C) (01/01 0800) Pulse Rate:  [52-105] 69 (01/01 1000) Resp:  [16-26] 25 (01/01 1000) BP: (103-151)/(68-97) 119/94 (01/01 1000) SpO2:  [94 %-100 %] 100 % (01/01 1000) FiO2 (%):  [40 %] 40 % (01/01 1000) Weight:  [131 lb 9.8 oz (59.7 kg)] 131 lb 9.8 oz (59.7 kg) (12/31 1321)  General - Well nourished, well developed, intubated.  Ophthalmologic - fundi not visualized due to noncooperation.  Cardiovascular - Regular rate and rhythm  Neuro -intubated, on sedation, eyes closed, not open to voice or pain stimulation.  Not following commands.  With eyelid held open, left pupil 1.5 mm, right pupil 3.5 mm, no reaction to light.  No tracking, not blinking to visual threat bilaterally.  Facial symmetry not able to test due to ET tube.  Right upper and lower extremity spontaneous movement, however not against gravity.  Left upper and lower extremity withdraw to pain, 2/5.  Bilateral Babinski, DTR 1+. Sensation, coordination and gait not tested.   ASSESSMENT/PLAN Mr. JAEKWON MCCLUNE is a 62 y.o. 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  Patient has been binge drinking for last 1 week without normal meal.  TPA given but not candidate for mechanical thrombectomy due to large infarct core.    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 Large vessel athero  Resultant intubated, unresponsive, uncal herniation  MRI not done due to palliative care  CTA head and neck -right ICA origin occlusion with associated right MCA and right ACA thrombosis.  Left A1 nondominant and a diminutive  CT repeat -no bleeding, but large right MCA and ACA and left ACA infarcts  with significant midline shift  2D Echo not done due to palliative care  LDL 49  HgbA1c 5.0  SCDs for VTE prophylaxis  Diet NPO time specified   No antithrombotic prior to admission, now on No antithrombotic within 24 hours of TPA.   Discussed with wife at bedside, she stated patient would not want any trach PEG or nursing home, if not have quality of life, he wants to die in dignity.  Wife would like to discuss with son to decide timing of comfort care  Palliative care consulted  Cerebral edema  Currently in anisocoria -indicating uncal herniation  On 3% saline  Na 152  Na goal 150-155  Continue 3% saline for now - no central line.  24h post tPA CT or MRI not done due to comfort care as per wife and pt wishes  Respiratory failure  Intubated on sedation  CCM on board  will consider extubation for comfort care once wife and son decide the timing  Heavy alcohol use  Heavy alcohol since teens  Binge drinking for the last one week without normal meal  Frequent pancreatitis flare up  ? 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 This patient's care requires constant monitoring of vital signs, hemodynamics, respiratory and cardiac monitoring, review of multiple databases, neurological assessment, discussion with family, other specialists and medical decision making of high complexity. I had long discussion with wife at bedside, updated pt current condition, treatment  options and poor prognosis. Wife stated patient would not want any trach PEG or nursing home, if not have quality of life, he wants to die in dignity.  Wife would like to discuss with son to decide timing of comfort care. I spent 45 minutes of neurocritical care time in the care of this patient.   Marvel Plan, MD PhD Stroke Neurology 12/03/2017 10:53 AM    To contact Stroke Continuity provider, please refer to [email or web address]. After hours, contact General Neurology

## 2017-12-03 DEATH — deceased

## 2017-12-04 DIAGNOSIS — F1010 Alcohol abuse, uncomplicated: Secondary | ICD-10-CM

## 2017-12-04 DIAGNOSIS — G935 Compression of brain: Secondary | ICD-10-CM

## 2017-12-04 DIAGNOSIS — G936 Cerebral edema: Secondary | ICD-10-CM

## 2017-12-04 DIAGNOSIS — F17200 Nicotine dependence, unspecified, uncomplicated: Secondary | ICD-10-CM

## 2017-12-04 LAB — VOLATILES,BLD-ACETONE,ETHANOL,ISOPROP,METHANOL
ACETONE, BLOOD: NEGATIVE % (ref 0.000–0.010)
ETHANOL, BLOOD: NEGATIVE % (ref 0.000–0.010)
ISOPROPANOL, BLOOD: NEGATIVE % (ref 0.000–0.010)
METHANOL, BLOOD: NEGATIVE % (ref 0.000–0.010)

## 2017-12-04 MED ORDER — LORAZEPAM 1 MG PO TABS: 1.0000 mg | ORAL_TABLET | ORAL | Status: DC | PRN

## 2017-12-04 MED ORDER — ACETAMINOPHEN 325 MG PO TABS: 650.0000 mg | ORAL_TABLET | Freq: Four times a day (QID) | ORAL | Status: DC | PRN

## 2017-12-04 MED ORDER — BIOTENE DRY MOUTH MT LIQD: 15.0000 mL | OROMUCOSAL | Status: DC | PRN

## 2017-12-04 MED ORDER — GLYCOPYRROLATE 0.2 MG/ML IJ SOLN
0.2000 mg | INTRAMUSCULAR | Status: DC | PRN
Start: 2017-12-04 — End: 2017-12-04

## 2017-12-04 MED ORDER — GLYCOPYRROLATE 0.2 MG/ML IJ SOLN: 0.2000 mg | INTRAMUSCULAR | Status: DC | PRN

## 2017-12-04 MED ORDER — GLYCOPYRROLATE 1 MG PO TABS
1.0000 mg | ORAL_TABLET | ORAL | Status: DC | PRN
  Filled 2017-12-04: qty 1

## 2017-12-04 MED ORDER — ACETAMINOPHEN 650 MG RE SUPP: 650.0000 mg | Freq: Four times a day (QID) | RECTAL | Status: DC | PRN

## 2017-12-04 MED ORDER — POLYVINYL ALCOHOL 1.4 % OP SOLN
1.0000 [drp] | Freq: Four times a day (QID) | OPHTHALMIC | Status: DC | PRN
  Filled 2017-12-04: qty 15

## 2017-12-04 MED ORDER — LORAZEPAM 2 MG/ML PO CONC: 1.0000 mg | ORAL | Status: DC | PRN

## 2017-12-04 MED ORDER — LORAZEPAM 2 MG/ML IJ SOLN
1.0000 mg | INTRAMUSCULAR | Status: DC | PRN
  Administered 2017-12-04: 1 mg via INTRAVENOUS
  Filled 2017-12-04: qty 1

## 2018-01-03 NOTE — Progress Notes (Signed)
Chaplain was present on floor at time of patient's expiration. Chaplain entered room and offered Ministry of Presence and Prayer for departed and family. Family is leaning in with sadness to the largess of the moment. Surviving wife and son appear well-supported by extended family system.

## 2018-01-03 NOTE — Progress Notes (Signed)
Pt expired at 1600, verified by 2nd [employer] Luisa. MD notified.  Candelaria StagersChaplin was in Unit and went to provide care to the family.

## 2018-01-03 NOTE — Progress Notes (Signed)
Witnessed waste of 124 ml of morphine sulfate.

## 2018-01-03 NOTE — Progress Notes (Signed)
Pt arrived to unit in hospital bed, with wife at his side.  Pt appeared to be comfortable, no s/s of distress or discomfort

## 2018-01-03 NOTE — Progress Notes (Signed)
Pt transferred to 6N11, with wife at bedside. Patient in no distress. Updated nurse at bedside. Dicie BeamFrazier, Delphia Kaylor RN BSN.

## 2018-01-03 NOTE — Progress Notes (Signed)
Wasted 124 ml/124 mg morphine sulfate, witnessed by Malen Gauzearol Huckabee, RN in the sink.

## 2018-01-03 NOTE — Death Summary Note (Deleted)
  The note originally documented on this encounter has been moved the the encounter in which it belongs.  

## 2018-01-03 NOTE — Progress Notes (Signed)
Mr. George Little extubated yesterday evening with plans to provide comfort measures only.  He is unresponsive this morning, he is not overtly distressed he is overtly cyanotic.  Family is at bedside and expressed their impression that the patient is not suffering.  I will not provide any change in intervention.

## 2018-01-03 NOTE — Progress Notes (Signed)
Palliative Medicine consult noted. Per notes, patient is now on comfort care with a morphine drip in place with clear GOC. Because of high referral volume, there will be a delay seeing this patient, so if PMT input is no longer needed, please let our team know.   Thank you for inviting us to see this patient.  Margret ChanceMelanie G. Janesha Brissette, RN, BSN, [facility] 12/20/2017 8:33 AM Office [phone]

## 2018-01-03 NOTE — Death Summary Note (Signed)
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     LABORATORY STUDIES CBC    Component Value Date/Time   WBC 10.5 12/03/2017 0305   RBC 4.92 12/03/2017 0305   HGB 14.9 12/03/2017 0305   HCT 46.0 12/03/2017 0305   PLT 173 12/03/2017 0305   MCV 93.5 12/03/2017 0305   MCH 30.3 12/03/2017 0305   MCHC 32.4 12/03/2017 0305   RDW 15.5 12/03/2017 0305   LYMPHSABS 1.2 12/03/2017 0305   MONOABS 0.8 12/03/2017 0305   EOSABS 0.0 12/03/2017 0305   BASOSABS 0.0 12/03/2017 0305   CMP    Component Value Date/Time   NA 157 (H) 12/03/2017 1511   K 3.6 12/03/2017 0305   CL 117 (H) 12/03/2017 0305   CO2 25 12/03/2017 0305   GLUCOSE 115 (H) 12/03/2017 0305   BUN 8 12/03/2017 0305   CREATININE 0.69 12/03/2017 0305   CALCIUM 7.6 (L) 12/03/2017 0305   PROT 5.5 (L) 12/03/2017 0305   ALBUMIN 2.6 (L) 12/03/2017 0305   AST 21 12/03/2017 0305   ALT 11 (L) 12/03/2017 0305   ALKPHOS 93 12/03/2017 0305   BILITOT 1.0 12/03/2017 0305   GFRNONAA >60 12/03/2017 0305   GFRAA >60 12/03/2017 0305   COAGS Lab Results  Component Value Date   INR 1.05 12-29-17   Lipid Panel    Component Value Date/Time   CHOL 96 12/03/2017 0305   TRIG 70 12/03/2017 0305   HDL 33 (L) 12/03/2017 0305   CHOLHDL 2.9 12/03/2017 0305   VLDL 14 12/03/2017 0305    LDLCALC 49 12/03/2017 0305   HgbA1C  Lab Results  Component Value Date   HGBA1C 5.0 12/03/2017   Urinalysis No results found for: COLORURINE, APPEARANCEUR, LABSPEC, PHURINE, GLUCOSEU, HGBUR, BILIRUBINUR, KETONESUR, PROTEINUR, UROBILINOGEN, NITRITE, LEUKOCYTESUR Urine Drug Screen     Component Value Date/Time   LABOPIA NONE DETECTED 12-29-2017 1059   COCAINSCRNUR NONE DETECTED 12-29-17 1059   LABBENZ NONE DETECTED December 29, 2017 1059   AMPHETMU NONE DETECTED 12-29-2017 1059   THCU POSITIVE (A) 12/29/17 1059   LABBARB NONE DETECTED December 29, 2017 1059    Alcohol Level    Component Value Date/Time   ETH <10 12-29-2017 1058     SIGNIFICANT DIAGNOSTIC STUDIES Ct Angio Head W Or Wo Contrast  Addendum Date: 2017/12/29   ADDENDUM REPORT: 12/29/17 14:00 ADDENDUM: Study discussed by telephone with Dr. Thad Ranger on 12/29/2017 at 1341 hours. Electronically Signed   By: Odessa Fleming M.D.   On: 12-29-17 14:00   Result Date: Dec 29, 2017 CLINICAL DATA:  62 year old male with altered mental status. Fell at home. Code stroke. EXAM: CT ANGIOGRAPHY HEAD AND NECK CT PERFUSION BRAIN TECHNIQUE: Multidetector CT imaging of the head and neck was performed using the standard protocol during bolus administration of intravenous contrast. Multiplanar CT  image reconstructions and MIPs were obtained to evaluate the vascular anatomy. Carotid stenosis measurements (when applicable) are obtained utilizing NASCET criteria, using the distal internal carotid diameter as the denominator. Multiphase CT imaging of the brain was performed following IV bolus contrast injection. Subsequent parametric perfusion maps were calculated using RAPID software. CONTRAST:  ISOVUE-370 IOPAMIDOL (ISOVUE-370) INJECTION 76% COMPARISON:  CT head and cervical spine at 1128 hours today. 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 No acute osseous abnormality identified. Upper chest: Centrilobular emphysema. No superior mediastinal lymphadenopathy. Other neck: Small volume retained secretions in the pharynx. Atrophied right submandibular gland. No neck mass or cervical lymphadenopathy. Aortic arch: 3 vessel arch configuration. Minimal arch atherosclerosis. Right carotid system: No brachiocephalic or right CCA origin stenosis. The right CCA is patent up to the right carotid bifurcation. The right ECA remains patent, but there is occlusion of the right ICA beginning at its origin, with an expanded appearance of the right ICA origin and bulb fills with low-density thrombus (series 10, image 55). There is only faint enhancement of the ventral lumen to the level of the distal bulb. The right ICA remains occluded to the skull base. Left carotid system: Normal left CCA origin. Minimal soft and calcified plaque at the posterior left ICA origin. No cervical left ICA stenosis. Vertebral arteries: No proximal right subclavian artery stenosis, minor soft and calcified plaque. Normal right vertebral artery origin. Mildly non dominant right vertebral artery is patent to the skullbase without stenosis. No proximal left subclavian artery stenosis despite soft and calcified plaque. Normal left vertebral artery origin. Dominant left vertebral artery is patent to the skullbase without stenosis. CTA HEAD Posterior circulation: The non dominant distal right vertebral artery is patent to the vertebrobasilar system with an early, patent right PICA origin. Dominant distal left vertebral artery has a normal left PICA origin. Patent vertebrobasilar junction and basilar artery without  stenosis. Normal SCA origins. Fetal type left PCA origin. The right posterior communicating artery appears to be occluded anteriorly, but the right PCA remains within normal limits. See series 10, image 78. Bilateral PCA branches are within normal limits. Anterior circulation: Occluded right ICA siphon and right ICA terminus. The right MCA is occluded. The right ACA is occluded. Poor collaterals. There is only some posterior right MCA and ACA territory collateral enhancement. Left ICA siphon is patent without atherosclerosis or stenosis. Normal left ophthalmic and posterior communicating artery origins. Normal left MCA origin, but the left ACA A1 segment is thread-like, appearing diminutive and non dominant. Non the less, there is left ACA enhancement throughout. No left ACA branch occlusion is identified. The left MCA M1 segment, left MCA bifurcation, and left MCA branches are within normal limits. Venous sinuses: Patent on the delayed images. Anatomic variants: Non dominant and diminutive left ACA A1 segment. Fetal type left PCA. Dominant left vertebral artery. Delayed phase: Early cytotoxic edema is now evident in the right inferior frontal gyrus and right anterior temporal lobe as seen on series 6, image 12. Cytotoxic edema is now evident in the right ACA and anterior right MCA territories above the level of the ventricles on series 6, image 21. No intracranial  mass effect. No acute intracranial hemorrhage identified. No ventriculomegaly. No abnormal parenchymal enhancement. Review of the MIP images confirms the above findings IMPRESSION: 1. Positive for Emergent Large Vessel Occlusion but CT Perfusion detects a large volume core infarct in the right MCA and ACA territories. Infarct volume estimated at 250 mL. 2. 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 3. The left ACA A1 segment is  non-dominant and diminutive, and CTP suggests superimposed left ACA/MCA watershed territory ischemia. 4. Cytotoxic edema now evident in the right MCA and right ACA territories. No associated hemorrhage. No intracranial mass effect at this time. 5. Little superimposed atherosclerosis in the head and neck. Therefore consider the possibility of cardiac or paradoxical thromboembolic disease. 6.  Emphysema (ICD10-J43.9). The findings of positive ELVO with large volume core infarct were communicated to Dr. Thad Ranger at 1:32 pmon 11/04/2017 by text page via the [facility] messaging system. Electronically Signed: By: Odessa Fleming M.D. On: 11/23/2017 13:34   Ct Head Wo Contrast  Result Date: 12/03/2017 CLINICAL DATA:  62 y/o  M; cerebral edema. EXAM: CT HEAD WITHOUT CONTRAST TECHNIQUE: Contiguous axial images were obtained from the base of the skull through the vertex without intravenous contrast. COMPARISON:  11/26/2017 CT of the head. FINDINGS: Brain: Massive infarction involving right MCA, right ACA and left anterior ACA territories. Involved brain includes the right anterior and superior temporal lobe, near entirety of right frontal lobe, right basal ganglia and posterior limb of internal capsule, right anterior and lateral parietal lobe, left paramedian frontal lobe, left caudate head, and genu/ rostrum of corpus callosum. Edema results in mass effect with diffuse sulcal effacement, 11 mm right-to-left midline shift, effacement of the right lateral ventricle and right uncal herniation. Midbrain is displaced leftward with partial effacement of left quadrigeminal plate cistern. No downward herniation at this time. No acute hemorrhage identified. Vascular: Hyperdensity within right terminal ICA and proximal MCA, probable thrombus. Skull: Normal. Negative for fracture or focal lesion. Sinuses/Orbits: No acute finding. Other: Right intra-ocular lens replacement. IMPRESSION: 1. Massive infarct involving right MCA, right ACA, and left  anterior ACA territories. No acute hemorrhage. 2. Cerebral edema with diffuse sulcal effacement, 11 mm right-to-left midline shift, right lateral ventricle effacement, and right uncal herniation. No downward herniation at this time. 3. Hyperdensity within right terminal ICA and proximal MCA and, probable thrombus. Critical Value/emergent results were called by telephone at the time of interpretation on 12/03/2017 at 3:20 am to Dr. Caryl Pina , who verbally acknowledged these results. Electronically Signed   By: Mitzi Hansen M.D.   On: 12/03/2017 03:24   Ct Head Wo Contrast  Addendum Date: 12/01/2017   ADDENDUM REPORT: 11/25/2017 20:15 ADDENDUM: The patient had a CT perfusion study and CT angiogram performed earlier in the same day [facility]. The studies were listed as a separate patient on the PACS at the time interpretation. In retrospect, there is subtle loss of gray-white differentiation throughout the right frontal lobe and temporal tip. This corresponds with pseudo normalization of the large acute right MCA territory infarct. The abnormal area involves the right ACA distribution as well. In comparison with prior studies the extensive subtle loss of gray-white differentiation corresponds within an ASPECTS score of 5/10 accounting for points in the basal ganglia and M3 and M6 segments. These findings were discussed with Dr. Laurence Slate at 7:45 p.m. 11/03/2017. Electronically Signed   By: Cristal Deer  Mattern M.D.   On: 11/11/2017 20:15   Result Date: 11/04/2017 CLINICAL DATA:  Code stroke.  TPA administered. EXAM: CT HEAD WITHOUT CONTRAST TECHNIQUE: Contiguous axial images were obtained from the base of the skull through the vertex without intravenous contrast. COMPARISON:  None. FINDINGS: Brain: Mild generalized volume loss. No evidence of old or acute focal infarction, mass lesion, hemorrhage, hydrocephalus or extra-axial collection. Vascular: Intravascular contrast is  present. Major arterial and venous structures show flow. Skull: Negative Sinuses/Orbits: Clear/normal Other: None IMPRESSION: Negative head CT. No acute finding. Aspects 10. There is intravenous iodinated contrast. These results were communicated to Dr. Laurence Slate At 3:23 pmon 12/07/2018by text page via the [facility] messaging system. Electronically Signed: By: Paulina Fusi M.D. On: 11/21/2017 15:25   Ct Head Wo Contrast  Result Date: 11/03/2017 CLINICAL DATA:  Altered level of consciousness EXAM: CT HEAD WITHOUT CONTRAST CT CERVICAL SPINE WITHOUT CONTRAST TECHNIQUE: Multidetector CT imaging of the head and cervical spine was performed following the standard protocol without intravenous contrast. Multiplanar CT image reconstructions of the cervical spine were also generated. COMPARISON:  None. FINDINGS: CT HEAD FINDINGS Brain: No evidence of acute infarction, hemorrhage, hydrocephalus, extra-axial collection or mass lesion/mass effect. Cortical atrophy, frontal and cerebellar predominant. 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 Vascular: No hyperdense vessel or unexpected calcification. Skull: Normal. Negative for fracture or focal lesion. Sinuses/Orbits: The visualized paranasal sinuses are essentially clear. The mastoid air cells are unopacified. Other: None. CT CERVICAL SPINE FINDINGS Alignment: Straightening of the cervical spine, likely positional. Skull base and vertebrae: No acute fracture. No primary bone lesion or focal pathologic process. Soft tissues and spinal canal: No prevertebral fluid or swelling. No visible canal hematoma. Disc levels: Mild multilevel degenerative changes, most prominent at C6-7. Mild-to-moderate spinal canal narrowing at C5-6 due to calcification of the posterior longitudinal ligament (series 5/ image 53). Upper chest: Visualized lung apices are notable for mild centrilobular and paraseptal emphysematous changes. Other: Visualized thyroid is unremarkable. IMPRESSION: No evidence of  acute intracranial abnormality. Mild cortical atrophy. 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 No evidence of traumatic injury to the cervical spine. Mild degenerative changes. Mild to moderate spinal canal narrowing at C5-6 due to calcification of the posterior longitudinal ligament. Electronically Signed   By: Charline Bills M.D.   On: 12/01/2017 11:46   Ct Angio Neck W Or Wo Contrast  Addendum Date: 11/12/2017   ADDENDUM REPORT: 11/11/2017 14:00 ADDENDUM: Study discussed by telephone with Dr. Thad Ranger on 11/03/2017 at 1341 hours. Electronically Signed   By: Odessa Fleming M.D.   On: 12/01/2017 14:00   Result Date: 12/02/2017 CLINICAL DATA:  62 year old male with altered mental status. Fell at home. Code stroke. EXAM: CT ANGIOGRAPHY HEAD AND NECK CT PERFUSION BRAIN TECHNIQUE: Multidetector CT imaging of the head and neck was performed using the standard protocol during bolus administration of intravenous contrast. Multiplanar CT image reconstructions and MIPs were obtained to evaluate the vascular anatomy. Carotid stenosis measurements (when applicable) are obtained utilizing NASCET criteria, using the distal internal carotid diameter as the denominator. Multiphase CT imaging of the brain was performed following IV bolus contrast injection. Subsequent parametric perfusion maps were calculated using RAPID software. CONTRAST:  ISOVUE-370 IOPAMIDOL (ISOVUE-370) INJECTION 76% COMPARISON:  CT head and cervical spine at 1128 hours today. 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 No acute osseous abnormality  identified. Upper chest: Centrilobular emphysema. No superior mediastinal lymphadenopathy. Other neck: Small volume retained secretions in the pharynx. Atrophied right submandibular gland. No neck mass or cervical lymphadenopathy. Aortic arch: 3 vessel arch configuration. Minimal arch atherosclerosis. Right carotid system: No brachiocephalic or right CCA origin stenosis. The right CCA is patent up to the right carotid bifurcation. The right ECA remains patent, but there is occlusion of the right ICA beginning at its origin, with an expanded appearance of the right ICA origin and bulb fills with low-density thrombus (series 10, image 55). There is only faint enhancement of the ventral lumen to the level of the distal bulb. The right ICA remains occluded to the skull base. Left carotid system: Normal left CCA origin. Minimal soft and calcified plaque at the posterior left ICA origin. No cervical left ICA stenosis. Vertebral arteries: No proximal right subclavian artery stenosis, minor soft and calcified plaque. Normal right vertebral artery origin. Mildly non dominant right vertebral artery is patent to the skullbase without stenosis. No proximal left subclavian artery stenosis despite soft and calcified plaque. Normal left vertebral artery origin. Dominant left vertebral artery is patent to the skullbase without stenosis. CTA HEAD Posterior circulation: The non dominant distal right vertebral artery is patent to the vertebrobasilar system with an early, patent right PICA origin. Dominant distal left vertebral artery has a normal left PICA origin. Patent vertebrobasilar junction and basilar artery without stenosis. Normal SCA origins. Fetal type left PCA origin. The right posterior communicating artery appears to be occluded anteriorly, but the right PCA remains within normal limits. See series 10, image 78. Bilateral PCA branches are within normal limits. Anterior circulation: Occluded right ICA siphon and right ICA  terminus. The right MCA is occluded. The right ACA is occluded. Poor collaterals. There is only some posterior right MCA and ACA territory collateral enhancement. Left ICA siphon is patent without atherosclerosis or stenosis. Normal left ophthalmic and posterior communicating artery origins. Normal left MCA origin, but the left ACA A1 segment is thread-like, appearing diminutive and non dominant. Non the less, there is left ACA enhancement throughout. No left ACA branch occlusion is identified. The left MCA M1 segment, left MCA bifurcation, and left MCA branches are within normal limits. Venous sinuses: Patent on the delayed images. Anatomic variants: Non dominant and diminutive left ACA A1 segment. Fetal type left PCA. Dominant left vertebral artery. Delayed phase: Early cytotoxic edema is now evident in the right inferior frontal gyrus and right anterior temporal lobe as seen on series 6, image 12. Cytotoxic edema is now evident in the right ACA and anterior right MCA territories above the level of the ventricles on series 6, image 21. No intracranial mass effect. No acute intracranial hemorrhage identified. No ventriculomegaly. No abnormal parenchymal enhancement. Review of the MIP images confirms the above findings IMPRESSION: 1. Positive for Emergent Large Vessel Occlusion but CT Perfusion detects a large volume core infarct in the right MCA and ACA territories. Infarct volume estimated at 250 mL. 2. 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 3. The left ACA A1 segment is non-dominant and diminutive, and CTP suggests superimposed left ACA/MCA watershed territory ischemia. 4. Cytotoxic edema now evident in the right MCA  and right ACA territories. No associated hemorrhage. No intracranial mass effect at this time. 5. Little superimposed atherosclerosis in the head and neck. Therefore  consider the possibility of cardiac or paradoxical thromboembolic disease. 6.  Emphysema (ICD10-J43.9). The findings of positive ELVO with large volume core infarct were communicated to Dr. Thad Ranger at 1:32 pmon 11/20/2017 by text page via the [facility] messaging system. Electronically Signed: By: Odessa Fleming M.D. On: 11/06/2017 13:34   Ct Cervical Spine Wo Contrast  Result Date: 11/02/2017 CLINICAL DATA:  Altered level of consciousness EXAM: CT HEAD WITHOUT CONTRAST CT CERVICAL SPINE WITHOUT CONTRAST TECHNIQUE: Multidetector CT imaging of the head and cervical spine was performed following the standard protocol without intravenous contrast. Multiplanar CT image reconstructions of the cervical spine were also generated. COMPARISON:  None. FINDINGS: CT HEAD FINDINGS Brain: No evidence of acute infarction, hemorrhage, hydrocephalus, extra-axial collection or mass lesion/mass effect. Cortical atrophy, frontal and cerebellar predominant. 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 Vascular: No hyperdense vessel or unexpected calcification. Skull: Normal. Negative for fracture or focal lesion. Sinuses/Orbits: The visualized paranasal sinuses are essentially clear. The mastoid air cells are unopacified. Other: None. CT CERVICAL SPINE FINDINGS Alignment: Straightening of the cervical spine, likely positional. Skull base and vertebrae: No acute fracture. No primary bone lesion or focal pathologic process. Soft tissues and spinal canal: No prevertebral fluid or swelling. No visible canal hematoma. Disc levels: Mild multilevel degenerative changes, most prominent at C6-7. Mild-to-moderate spinal canal narrowing at C5-6 due to calcification of the posterior longitudinal ligament (series 5/ image 53). Upper chest: Visualized lung apices are notable for mild centrilobular and paraseptal emphysematous changes. Other: Visualized thyroid is unremarkable. IMPRESSION: No evidence of acute intracranial abnormality. Mild cortical atrophy.  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 No evidence of traumatic injury to the cervical spine. Mild degenerative changes. Mild to moderate spinal canal narrowing at C5-6 due to calcification of the posterior longitudinal ligament. Electronically Signed   By: Charline Bills M.D.   On: 12/01/2017 11:46   Ct Cerebral Perfusion W Contrast  Addendum Date: 11/09/2017   ADDENDUM REPORT: 11/15/2017 14:00 ADDENDUM: Study discussed by telephone with Dr. Thad Ranger on 11/24/2017 at 1341 hours. Electronically Signed   By: Odessa Fleming M.D.   On: 11/24/2017 14:00   Result Date: 11/30/2017 CLINICAL DATA:  62 year old male with altered mental status. Fell at home. Code stroke. EXAM: CT ANGIOGRAPHY HEAD AND NECK CT PERFUSION BRAIN TECHNIQUE: Multidetector CT imaging of the head and neck was performed using the standard protocol during bolus administration of intravenous contrast. Multiplanar CT image reconstructions and MIPs were obtained to evaluate the vascular anatomy. Carotid stenosis measurements (when applicable) are obtained utilizing NASCET criteria, using the distal internal carotid diameter as the denominator. Multiphase CT imaging of the brain was performed following IV bolus contrast injection. Subsequent parametric perfusion maps were calculated using RAPID software. CONTRAST:  ISOVUE-370 IOPAMIDOL (ISOVUE-370) INJECTION 76% COMPARISON:  CT head and cervical spine at 1128 hours today. 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 No acute osseous abnormality identified. Upper chest: Centrilobular emphysema. No  superior mediastinal lymphadenopathy. Other neck: Small volume retained secretions in the pharynx. Atrophied right submandibular  gland. No neck mass or cervical lymphadenopathy. Aortic arch: 3 vessel arch configuration. Minimal arch atherosclerosis. Right carotid system: No brachiocephalic or right CCA origin stenosis. The right CCA is patent up to the right carotid bifurcation. The right ECA remains patent, but there is occlusion of the right ICA beginning at its origin, with an expanded appearance of the right ICA origin and bulb fills with low-density thrombus (series 10, image 55). There is only faint enhancement of the ventral lumen to the level of the distal bulb. The right ICA remains occluded to the skull base. Left carotid system: Normal left CCA origin. Minimal soft and calcified plaque at the posterior left ICA origin. No cervical left ICA stenosis. Vertebral arteries: No proximal right subclavian artery stenosis, minor soft and calcified plaque. Normal right vertebral artery origin. Mildly non dominant right vertebral artery is patent to the skullbase without stenosis. No proximal left subclavian artery stenosis despite soft and calcified plaque. Normal left vertebral artery origin. Dominant left vertebral artery is patent to the skullbase without stenosis. CTA HEAD Posterior circulation: The non dominant distal right vertebral artery is patent to the vertebrobasilar system with an early, patent right PICA origin. Dominant distal left vertebral artery has a normal left PICA origin. Patent vertebrobasilar junction and basilar artery without stenosis. Normal SCA origins. Fetal type left PCA origin. The right posterior communicating artery appears to be occluded anteriorly, but the right PCA remains within normal limits. See series 10, image 78. Bilateral PCA branches are within normal limits. Anterior circulation: Occluded right ICA siphon and right ICA terminus. The right MCA is occluded. The right ACA is  occluded. Poor collaterals. There is only some posterior right MCA and ACA territory collateral enhancement. Left ICA siphon is patent without atherosclerosis or stenosis. Normal left ophthalmic and posterior communicating artery origins. Normal left MCA origin, but the left ACA A1 segment is thread-like, appearing diminutive and non dominant. Non the less, there is left ACA enhancement throughout. No left ACA branch occlusion is identified. The left MCA M1 segment, left MCA bifurcation, and left MCA branches are within normal limits. Venous sinuses: Patent on the delayed images. Anatomic variants: Non dominant and diminutive left ACA A1 segment. Fetal type left PCA. Dominant left vertebral artery. Delayed phase: Early cytotoxic edema is now evident in the right inferior frontal gyrus and right anterior temporal lobe as seen on series 6, image 12. Cytotoxic edema is now evident in the right ACA and anterior right MCA territories above the level of the ventricles on series 6, image 21. No intracranial mass effect. No acute intracranial hemorrhage identified. No ventriculomegaly. No abnormal parenchymal enhancement. Review of the MIP images confirms the above findings IMPRESSION: 1. Positive for Emergent Large Vessel Occlusion but CT Perfusion detects a large volume core infarct in the right MCA and ACA territories. Infarct volume estimated at 250 mL. 2. 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 3. The left ACA A1 segment is non-dominant and diminutive, and CTP suggests superimposed left ACA/MCA watershed territory ischemia. 4. Cytotoxic edema now evident in the right MCA and right ACA territories. No associated hemorrhage. No intracranial mass effect at this time. 5. Little superimposed atherosclerosis in the head and neck. Therefore consider the possibility of cardiac or paradoxical  thromboembolic disease. 6.  Emphysema (ICD10-J43.9). The findings of positive ELVO with large volume core infarct were communicated to Dr. Thad Rangereynolds at  1:32 pmon 19-Dec-2017 by text page via the [facility] messaging system. Electronically Signed: By: Odessa Fleming M.D. On: 12/19/17 13:34   Dg Chest Port 1 View  Result Date: Dec 19, 2017 CLINICAL DATA:  Status post intubation EXAM: PORTABLE CHEST 1 VIEW COMPARISON:  None. FINDINGS: Endotracheal tube is noted in satisfactory position. The lungs are well aerated bilaterally. No focal infiltrate or sizable effusion is seen. The cardiac shadow is within normal limits. Under changes in the proximal left humerus are noted. IMPRESSION: Endotracheal tube in satisfactory position. Electronically Signed   By: Alcide Clever M.D.   On: 12-19-2017 15:51   Dg Chest Portable 1 View  Result Date: 12/19/17 CLINICAL DATA:  Patient fell at home.  No reported chest complaints. EXAM: PORTABLE CHEST 1 VIEW COMPARISON:  None in PACs FINDINGS: The lungs are well-expanded and clear. The heart and pulmonary vascularity are normal. The mediastinum is normal in width. There is no pleural effusion. The observed bony thorax is normal. There is chronic deformity of the left humeral head and proximal shaft. IMPRESSION: There is no active cardiopulmonary disease. Electronically Signed   By: David  [place] M.D.   On: 12/19/17 12:06   Dg Abd Portable 1v  Result Date: 12-19-17 CLINICAL DATA:  OG tube placement. EXAM: PORTABLE ABDOMEN - 1 VIEW COMPARISON:  None. FINDINGS: Enteric tube in place with the tip near the pylorus. Nonobstructive bowel gas pattern. Medullary nephrocalcinosis. IMPRESSION: 1. Appropriately positioned enteric tube. 2. Medullary nephrocalcinosis. Electronically Signed   By: Obie Dredge M.D.   On: 12-19-17 20:09     HISTORY OF PRESENT ILLNESS  George Little is an 62 y.o. male with PMH of cirrhosis, pancreatitis, TBI presents after a fall to [facility].    Patient has been drinking moonshine last 3 days and around 10 PM the patient started vomiting and had a fall. It appears he was awake this morning and conversational according to his wife. Initially presented as a TRAUMA ALERT. Later in the ER his son noted that his left side was weak which is new for him. Code stroke was called and his initial NIHSS was 17. He underwent CT angiogram and CT perfusion which showed a right ICA occlusion with CT perfusion showing large core in the right MCA territory of 250 cc. He received TPA at [facility] and transferred to [facility] ER.   ED course  A repeat CT head was consistent for early acute ischemic stroke in R MCA and ACA territory with ASPECTS of 3-4 ( although official read was 10). Patient started vomiting and had a GCS of 7 on arrival and was intubated shortly after CT scan. He was subsequently admitted to the ICU team   Date last known well: 19-Dec-2017 Time last known well: 10 AM tPA Given: yes NIHSS: 17 on arrival to [facility] Baseline MRS 0  HOSPITAL COURSE Mr. George Little is a 62 y.o. 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  Patient has been binge drinking for last 1 week without normal meal.  TPA given but not candidate for mechanical thrombectomy due to large infarct core.    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 Large vessel athero  Resultant coma, uncal herniation  MRI not done due to palliative care  CTA  head and neck -right ICA origin occlusion with associated right MCA and right ACA thrombosis.  Left A1 nondominant and a diminutive  CT repeat -no bleeding, but large right MCA and ACA and left ACA infarcts with significant midline shift  2D Echo not done due to comfort care  LDL  49  HgbA1c 5.0  Diet NPO time specified   On comfort care due to poor prognosis  Cerebral edema with uncal herniation  Off 3% saline  On comfort care  Respiratory failure  One way extubation for comfort care  Heavy alcohol use  Heavy alcohol since teens  Binge drinking for the last one week without normal meal  Frequent pancreatitis flare up  ? 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   20 minutes were spent preparing discharge.  Marvel Plan, MD PhD Stroke Neurology 12/10/2017 6:07 PM

## 2018-01-03 NOTE — Progress Notes (Signed)
STROKE TEAM PROGRESS NOTE   SUBJECTIVE (INTERVAL HISTORY) His wife and son are at bedside. Family decided comfort care yesterday. Extubated and on morphine drip for comfort care. Pt comatose, seems no discomfort. No questions from family this am.   OBJECTIVE Temp:  [102 F (38.9 C)] 102 F (38.9 C) (01/01 1202) Pulse Rate:  [50-113] 113 (01/02 0600) Cardiac Rhythm: Normal sinus rhythm (01/01 2000) Resp:  [16-26] 16 (01/02 0600) BP: (115-130)/(58-94) 130/64 (01/02 0600) SpO2:  [61 %-100 %] 61 % (01/02 0600) FiO2 (%):  [40 %] 40 % (01/01 1700)  Recent Labs  Lab 11/21/2017 1055 11/03/2017 1229  GLUCAP 159* 150*   Recent Labs  Lab 12/01/2017 1201 11/15/2017 1242 11/16/2017 1623 11/17/2017 2059 12/03/17 0305 12/03/17 0832 12/03/17 1511  NA 139  --  139 145 148* 152* 157*  K 4.0  --   --   --  3.6  --   --   CL 99*  --   --   --  117*  --   --   CO2 29  --   --   --  25  --   --   GLUCOSE 164*  --   --   --  115*  --   --   BUN 9  --   --   --  8  --   --   CREATININE 0.71 0.70  --   --  0.69  --   --   CALCIUM 7.9*  --   --   --  7.6*  --   --   MG  --   --   --   --  1.8  --   --   PHOS  --   --   --   --  2.6  --   --    Recent Labs  Lab 12/01/2017 1201 12/03/17 0305  AST 28 21  ALT 12* 11*  ALKPHOS 121 93  BILITOT 1.6* 1.0  PROT 6.9 5.5*  ALBUMIN 3.4* 2.6*   Recent Labs  Lab 11/02/2017 1058 12/03/17 0305  WBC 11.0* 10.5  NEUTROABS 9.0* 8.5*  HGB 17.2 14.9  HCT 52.0 46.0  MCV 89.4 93.5  PLT 293 173   Recent Labs  Lab 11/02/2017 1201  TROPONINI <0.03   Recent Labs    11/25/2017 1059  LABPROT 13.6  INR 1.05   No results for input(s): COLORURINE, LABSPEC, PHURINE, GLUCOSEU, HGBUR, BILIRUBINUR, KETONESUR, PROTEINUR, UROBILINOGEN, NITRITE, LEUKOCYTESUR in the last 72 hours.  Invalid input(s): APPERANCEUR     Component Value Date/Time   CHOL 96 12/03/2017 0305   TRIG 70 12/03/2017 0305   HDL 33 (L) 12/03/2017 0305   CHOLHDL 2.9 12/03/2017 0305   VLDL 14  12/03/2017 0305   LDLCALC 49 12/03/2017 0305   Lab Results  Component Value Date   HGBA1C 5.0 12/03/2017      Component Value Date/Time   LABOPIA NONE DETECTED 11/12/2017 1059   COCAINSCRNUR NONE DETECTED 11/04/2017 1059   LABBENZ NONE DETECTED 11/08/2017 1059   AMPHETMU NONE DETECTED 11/18/2017 1059   THCU POSITIVE (A) 11/13/2017 1059   LABBARB NONE DETECTED 11/22/2017 1059    Recent Labs  Lab 11/23/2017 1058  ETH <10    I have personally reviewed the radiological images below and agree with the radiology interpretations.  Ct Angio Head W Or Wo Contrast  Addendum Date: 11/19/2017   ADDENDUM REPORT: 11/26/2017 14:00 ADDENDUM: Study discussed by telephone with Dr. Doy Mince on 11/19/2017  at 1341 hours. Electronically Signed   By: Genevie Ann M.D.   On: 11/04/2017 14:00   Result Date: 11/17/2017 CLINICAL DATA:  62 year old male with altered mental status. Fell at home. Code stroke. EXAM: CT ANGIOGRAPHY HEAD AND NECK CT PERFUSION BRAIN TECHNIQUE: Multidetector CT imaging of the head and neck was performed using the standard protocol during bolus administration of intravenous contrast. Multiplanar CT image reconstructions and MIPs were obtained to evaluate the vascular anatomy. Carotid stenosis measurements (when applicable) are obtained utilizing NASCET criteria, using the distal internal carotid diameter as the denominator. Multiphase CT imaging of the brain was performed following IV bolus contrast injection. Subsequent parametric perfusion maps were calculated using RAPID software. CONTRAST:  134m ISOVUE-370 IOPAMIDOL (ISOVUE-370) INJECTION 76% COMPARISON:  CT head and cervical spine at 1128 hours today. 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 No acute osseous abnormality identified. Upper chest: Centrilobular emphysema. No superior mediastinal lymphadenopathy. Other neck: Small volume retained secretions in the pharynx. Atrophied right submandibular gland. No neck mass or cervical lymphadenopathy. Aortic arch: 3 vessel arch configuration. Minimal arch atherosclerosis. Right carotid system: No brachiocephalic or right CCA origin stenosis. The right CCA is patent up to the right carotid bifurcation. The right ECA remains patent, but there is occlusion of the right ICA beginning at its origin, with an expanded appearance of the right ICA origin and bulb fills with low-density thrombus (series 10, image 55). There is only faint enhancement of the ventral lumen to the level of the distal bulb. The right ICA remains occluded to the skull base. Left carotid system: Normal left CCA origin. Minimal soft and calcified plaque at the posterior left ICA origin. No cervical left ICA stenosis. Vertebral arteries: No proximal right subclavian artery stenosis, minor soft and calcified plaque. Normal right vertebral artery origin. Mildly non dominant right vertebral artery is patent to the skullbase without stenosis. No proximal left subclavian artery stenosis despite soft and calcified plaque. Normal left vertebral artery origin. Dominant left vertebral artery is patent to the skullbase without stenosis. CTA HEAD Posterior circulation: The non dominant distal right vertebral artery is patent to the vertebrobasilar system with an early, patent right PICA origin. Dominant distal left vertebral artery has a normal left PICA origin. Patent vertebrobasilar junction and basilar artery without stenosis. Normal SCA origins. Fetal type left PCA origin. The right posterior communicating artery appears to be occluded anteriorly, but  the right PCA remains within normal limits. See series 10, image 78. Bilateral PCA branches are within normal limits. Anterior circulation: Occluded right ICA siphon and right ICA terminus. The right MCA is occluded. The right ACA is occluded. Poor collaterals. There is only some posterior right MCA and ACA territory collateral enhancement. Left ICA siphon is patent without atherosclerosis or stenosis. Normal left ophthalmic and posterior communicating artery origins. Normal left MCA origin, but the left ACA A1 segment is thread-like, appearing diminutive and non dominant. Non the less, there is left ACA enhancement throughout. No left ACA branch occlusion is identified. The left MCA M1 segment, left MCA bifurcation, and left MCA branches are within normal limits. Venous sinuses: Patent on the delayed images. Anatomic variants: Non  dominant and diminutive left ACA A1 segment. Fetal type left PCA. Dominant left vertebral artery. Delayed phase: Early cytotoxic edema is now evident in the right inferior frontal gyrus and right anterior temporal lobe as seen on series 6, image 12. Cytotoxic edema is now evident in the right ACA and anterior right MCA territories above the level of the ventricles on series 6, image 21. No intracranial mass effect. No acute intracranial hemorrhage identified. No ventriculomegaly. No abnormal parenchymal enhancement. Review of the MIP images confirms the above findings IMPRESSION: 1. Positive for Emergent Large Vessel Occlusion but CT Perfusion detects a large volume core infarct in the right MCA and ACA territories. Infarct volume estimated at 250 mL. 2. 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 3. The left ACA A1 segment is non-dominant and diminutive, and CTP suggests superimposed left ACA/MCA watershed territory ischemia. 4. Cytotoxic edema now evident in the  right MCA and right ACA territories. No associated hemorrhage. No intracranial mass effect at this time. 5. Little superimposed atherosclerosis in the head and neck. Therefore consider the possibility of cardiac or paradoxical thromboembolic disease. 6.  Emphysema (ICD10-J43.9). The findings of positive ELVO with large volume core infarct were communicated to Dr. Doy Mince at 1:32 pmon 11/18/2017 by text page via the [facility] messaging system. Electronically Signed: By: Genevie Ann M.D. On: 11/19/2017 13:34   Ct Head Wo Contrast  Result Date: 12/03/2017 CLINICAL DATA:  62 y/o  M; cerebral edema. EXAM: CT HEAD WITHOUT CONTRAST TECHNIQUE: Contiguous axial images were obtained from the base of the skull through the vertex without intravenous contrast. COMPARISON:  11/13/2017 CT of the head. FINDINGS: Brain: Massive infarction involving right MCA, right ACA and left anterior ACA territories. Involved brain includes the right anterior and superior temporal lobe, near entirety of right frontal lobe, right basal ganglia and posterior limb of internal capsule, right anterior and lateral parietal lobe, left paramedian frontal lobe, left caudate head, and genu/ rostrum of corpus callosum. Edema results in mass effect with diffuse sulcal effacement, 11 mm right-to-left midline shift, effacement of the right lateral ventricle and right uncal herniation. Midbrain is displaced leftward with partial effacement of left quadrigeminal plate cistern. No downward herniation at this time. No acute hemorrhage identified. Vascular: Hyperdensity within right terminal ICA and proximal MCA, probable thrombus. Skull: Normal. Negative for fracture or focal lesion. Sinuses/Orbits: No acute finding. Other: Right intra-ocular lens replacement. IMPRESSION: 1. Massive infarct involving right MCA, right ACA, and left anterior ACA territories. No acute hemorrhage. 2. Cerebral edema with diffuse sulcal effacement, 11 mm right-to-left midline shift, right  lateral ventricle effacement, and right uncal herniation. No downward herniation at this time. 3. Hyperdensity within right terminal ICA and proximal MCA and, probable thrombus. Critical Value/emergent results were called by telephone at the time of interpretation on 12/03/2017 at 3:20 am to Dr. Kerney Elbe , who verbally acknowledged these results. Electronically Signed   By: Kristine Garbe M.D.   On: 12/03/2017 03:24   Ct Head Wo Contrast  Addendum Date: 11/09/2017   ADDENDUM REPORT: 11/27/2017 20:15 ADDENDUM: The patient had a CT perfusion study and CT angiogram performed earlier in the same day [facility]. The studies were listed as a separate patient on the PACS at the time interpretation. In retrospect, there is subtle loss of gray-white differentiation throughout the right frontal lobe and temporal tip. This corresponds with  pseudo normalization of the large acute right MCA territory infarct. The abnormal area involves the right ACA distribution as well. In comparison with prior studies the extensive subtle loss of gray-white differentiation corresponds within an ASPECTS score of 5/10 accounting for points in the basal ganglia and M3 and M6 segments. These findings were discussed with Dr. Lorraine Lax at 7:45 p.m. 11/12/2017. Electronically Signed   By: San Morelle M.D.   On: 11/22/2017 20:15   Result Date: 11/14/2017 CLINICAL DATA:  Code stroke.  TPA administered. EXAM: CT HEAD WITHOUT CONTRAST TECHNIQUE: Contiguous axial images were obtained from the base of the skull through the vertex without intravenous contrast. COMPARISON:  None. FINDINGS: Brain: Mild generalized volume loss. No evidence of old or acute focal infarction, mass lesion, hemorrhage, hydrocephalus or extra-axial collection. Vascular: Intravascular contrast is present. Major arterial and venous structures show flow. Skull: Negative Sinuses/Orbits: Clear/normal Other: None IMPRESSION: Negative head CT.  No acute finding. Aspects 10. There is intravenous iodinated contrast. These results were communicated to Dr. Lorraine Lax At 3:23 pmon 12/16/2018by text page via the [facility] messaging system. Electronically Signed: By: Nelson Chimes M.D. On: 11/08/2017 15:25   Ct Head Wo Contrast  Result Date: 11/25/2017 CLINICAL DATA:  Altered level of consciousness EXAM: CT HEAD WITHOUT CONTRAST CT CERVICAL SPINE WITHOUT CONTRAST TECHNIQUE: Multidetector CT imaging of the head and cervical spine was performed following the standard protocol without intravenous contrast. Multiplanar CT image reconstructions of the cervical spine were also generated. COMPARISON:  None. FINDINGS: CT HEAD FINDINGS Brain: No evidence of acute infarction, hemorrhage, hydrocephalus, extra-axial collection or mass lesion/mass effect. Cortical atrophy, frontal and cerebellar predominant. 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 Vascular: No hyperdense vessel or unexpected calcification. Skull: Normal. Negative for fracture or focal lesion. Sinuses/Orbits: The visualized paranasal sinuses are essentially clear. The mastoid air cells are unopacified. Other: None. CT CERVICAL SPINE FINDINGS Alignment: Straightening of the cervical spine, likely positional. Skull base and vertebrae: No acute fracture. No primary bone lesion or focal pathologic process. Soft tissues and spinal canal: No prevertebral fluid or swelling. No visible canal hematoma. Disc levels: Mild multilevel degenerative changes, most prominent at C6-7. Mild-to-moderate spinal canal narrowing at C5-6 due to calcification of the posterior longitudinal ligament (series 5/ image 53). Upper chest: Visualized lung apices are notable for mild centrilobular and paraseptal emphysematous changes. Other: Visualized thyroid is unremarkable. IMPRESSION: No evidence of acute intracranial abnormality. Mild cortical atrophy. 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 No evidence of traumatic injury to the  cervical spine. Mild degenerative changes. Mild to moderate spinal canal narrowing at C5-6 due to calcification of the posterior longitudinal ligament. Electronically Signed   By: Julian Hy M.D.   On: 11/25/2017 11:46   Ct Angio Neck W Or Wo Contrast  Addendum Date: 11/13/2017   ADDENDUM REPORT: 11/15/2017 14:00 ADDENDUM: Study discussed by telephone with Dr. Doy Mince on 11/14/2017 at 1341 hours. Electronically Signed   By: Genevie Ann M.D.   On: 11/11/2017 14:00   Result Date: 11/05/2017 CLINICAL DATA:  62 year old male with altered mental status. Fell at home. Code stroke. EXAM: CT ANGIOGRAPHY HEAD AND NECK CT PERFUSION BRAIN TECHNIQUE: Multidetector CT imaging of the head and neck was performed using the standard protocol during bolus administration of intravenous contrast. Multiplanar CT image reconstructions and MIPs were obtained to evaluate the vascular anatomy. Carotid stenosis measurements (when applicable) are obtained utilizing NASCET criteria, using the distal internal carotid diameter as the denominator. Multiphase CT imaging of the brain was performed  following IV bolus contrast injection. Subsequent parametric perfusion maps were calculated using RAPID software. CONTRAST:  141m ISOVUE-370 IOPAMIDOL (ISOVUE-370) INJECTION 76% COMPARISON:  CT head and cervical spine at 1128 hours today. 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 No acute osseous abnormality identified. Upper chest: Centrilobular emphysema. No superior mediastinal lymphadenopathy. Other neck: Small volume retained secretions in the  pharynx. Atrophied right submandibular gland. No neck mass or cervical lymphadenopathy. Aortic arch: 3 vessel arch configuration. Minimal arch atherosclerosis. Right carotid system: No brachiocephalic or right CCA origin stenosis. The right CCA is patent up to the right carotid bifurcation. The right ECA remains patent, but there is occlusion of the right ICA beginning at its origin, with an expanded appearance of the right ICA origin and bulb fills with low-density thrombus (series 10, image 55). There is only faint enhancement of the ventral lumen to the level of the distal bulb. The right ICA remains occluded to the skull base. Left carotid system: Normal left CCA origin. Minimal soft and calcified plaque at the posterior left ICA origin. No cervical left ICA stenosis. Vertebral arteries: No proximal right subclavian artery stenosis, minor soft and calcified plaque. Normal right vertebral artery origin. Mildly non dominant right vertebral artery is patent to the skullbase without stenosis. No proximal left subclavian artery stenosis despite soft and calcified plaque. Normal left vertebral artery origin. Dominant left vertebral artery is patent to the skullbase without stenosis. CTA HEAD Posterior circulation: The non dominant distal right vertebral artery is patent to the vertebrobasilar system with an early, patent right PICA origin. Dominant distal left vertebral artery has a normal left PICA origin. Patent vertebrobasilar junction and basilar artery without stenosis. Normal SCA origins. Fetal type left PCA origin. The right posterior communicating artery appears to be occluded anteriorly, but the right PCA remains within normal limits. See series 10, image 78. Bilateral PCA branches are within normal limits. Anterior circulation: Occluded right ICA siphon and right ICA terminus. The right MCA is occluded. The right ACA is occluded. Poor collaterals. There is only some posterior right MCA and ACA territory  collateral enhancement. Left ICA siphon is patent without atherosclerosis or stenosis. Normal left ophthalmic and posterior communicating artery origins. Normal left MCA origin, but the left ACA A1 segment is thread-like, appearing diminutive and non dominant. Non the less, there is left ACA enhancement throughout. No left ACA branch occlusion is identified. The left MCA M1 segment, left MCA bifurcation, and left MCA branches are within normal limits. Venous sinuses: Patent on the delayed images. Anatomic variants: Non dominant and diminutive left ACA A1 segment. Fetal type left PCA. Dominant left vertebral artery. Delayed phase: Early cytotoxic edema is now evident in the right inferior frontal gyrus and right anterior temporal lobe as seen on series 6, image 12. Cytotoxic edema is now evident in the right ACA and anterior right MCA territories above the level of the ventricles on series 6, image 21. No intracranial mass effect. No acute intracranial hemorrhage identified. No ventriculomegaly. No abnormal parenchymal enhancement. Review of the MIP images confirms the above findings IMPRESSION: 1. Positive for Emergent Large Vessel Occlusion but CT Perfusion detects a large volume core  infarct in the right MCA and ACA territories. Infarct volume estimated at 250 mL. 2. 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 3. The left ACA A1 segment is non-dominant and diminutive, and CTP suggests superimposed left ACA/MCA watershed territory ischemia. 4. Cytotoxic edema now evident in the right MCA and right ACA territories. No associated hemorrhage. No intracranial mass effect at this time. 5. Little superimposed atherosclerosis in the head and neck. Therefore consider the possibility of cardiac or paradoxical thromboembolic disease. 6.  Emphysema (ICD10-J43.9). The findings of positive ELVO with  large volume core infarct were communicated to Dr. Doy Mince at 1:32 pmon 11/12/2017 by text page via the [facility] messaging system. Electronically Signed: By: Genevie Ann M.D. On: 11/02/2017 13:34   Ct Cervical Spine Wo Contrast  Result Date: 11/21/2017 CLINICAL DATA:  Altered level of consciousness EXAM: CT HEAD WITHOUT CONTRAST CT CERVICAL SPINE WITHOUT CONTRAST TECHNIQUE: Multidetector CT imaging of the head and cervical spine was performed following the standard protocol without intravenous contrast. Multiplanar CT image reconstructions of the cervical spine were also generated. COMPARISON:  None. FINDINGS: CT HEAD FINDINGS Brain: No evidence of acute infarction, hemorrhage, hydrocephalus, extra-axial collection or mass lesion/mass effect. Cortical atrophy, frontal and cerebellar predominant. 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 Vascular: No hyperdense vessel or unexpected calcification. Skull: Normal. Negative for fracture or focal lesion. Sinuses/Orbits: The visualized paranasal sinuses are essentially clear. The mastoid air cells are unopacified. Other: None. CT CERVICAL SPINE FINDINGS Alignment: Straightening of the cervical spine, likely positional. Skull base and vertebrae: No acute fracture. No primary bone lesion or focal pathologic process. Soft tissues and spinal canal: No prevertebral fluid or swelling. No visible canal hematoma. Disc levels: Mild multilevel degenerative changes, most prominent at C6-7. Mild-to-moderate spinal canal narrowing at C5-6 due to calcification of the posterior longitudinal ligament (series 5/ image 53). Upper chest: Visualized lung apices are notable for mild centrilobular and paraseptal emphysematous changes. Other: Visualized thyroid is unremarkable. IMPRESSION: No evidence of acute intracranial abnormality. Mild cortical atrophy. 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 No evidence of traumatic injury to the cervical spine. Mild degenerative changes. Mild to  moderate spinal canal narrowing at C5-6 due to calcification of the posterior longitudinal ligament. Electronically Signed   By: Julian Hy M.D.   On: 11/11/2017 11:46   Ct Cerebral Perfusion W Contrast  Addendum Date: 11/14/2017   ADDENDUM REPORT: 11/03/2017 14:00 ADDENDUM: Study discussed by telephone with Dr. Doy Mince on 11/04/2017 at 1341 hours. Electronically Signed   By: Genevie Ann M.D.   On: 12/01/2017 14:00   Result Date: 11/23/2017 CLINICAL DATA:  62 year old male with altered mental status. Fell at home. Code stroke. EXAM: CT ANGIOGRAPHY HEAD AND NECK CT PERFUSION BRAIN TECHNIQUE: Multidetector CT imaging of the head and neck was performed using the standard protocol during bolus administration of intravenous contrast. Multiplanar CT image reconstructions and MIPs were obtained to evaluate the vascular anatomy. Carotid stenosis measurements (when applicable) are obtained utilizing NASCET criteria, using the distal internal carotid diameter as the denominator. Multiphase CT imaging of the brain was performed following IV bolus contrast injection. Subsequent parametric perfusion maps were calculated using RAPID software. CONTRAST:  168m ISOVUE-370 IOPAMIDOL (ISOVUE-370) INJECTION 76% COMPARISON:  CT head and cervical spine at 1128 hours today. 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 No acute osseous abnormality identified. Upper chest: Centrilobular emphysema. No superior mediastinal lymphadenopathy. Other neck: Small volume retained secretions in the pharynx. Atrophied right submandibular gland. No  neck mass or cervical lymphadenopathy. Aortic arch: 3 vessel arch configuration. Minimal arch atherosclerosis. Right carotid system: No brachiocephalic or right CCA origin stenosis. The right CCA is patent up to the right carotid bifurcation. The right ECA remains patent, but there is occlusion of the right ICA beginning at its origin, with an expanded appearance of the right ICA origin and bulb fills with low-density thrombus (series 10, image 55). There is only faint enhancement of the ventral lumen to the level of the distal bulb. The right ICA remains occluded to the skull base. Left carotid system: Normal left CCA origin. Minimal soft and calcified plaque at the posterior left ICA origin. No cervical left ICA stenosis. Vertebral arteries: No proximal right subclavian artery stenosis, minor soft and calcified plaque. Normal right vertebral artery origin. Mildly non dominant right vertebral artery is patent to the skullbase without stenosis. No proximal left subclavian artery stenosis despite soft and calcified plaque. Normal left vertebral artery origin. Dominant left vertebral artery is patent to the skullbase without stenosis. CTA HEAD Posterior circulation: The non dominant distal right vertebral artery is patent to the vertebrobasilar system with an early, patent right PICA origin. Dominant distal left vertebral artery has a normal left PICA origin. Patent vertebrobasilar junction and basilar artery without stenosis. Normal SCA origins. Fetal type left PCA origin. The right posterior communicating artery appears to be occluded anteriorly, but the right PCA remains within normal limits. See series 10, image 78. Bilateral PCA branches are within normal limits. Anterior circulation: Occluded right ICA siphon and right ICA terminus. The right MCA is occluded. The right ACA is occluded. Poor collaterals. There is only some posterior right MCA and ACA territory collateral enhancement. Left ICA siphon is patent  without atherosclerosis or stenosis. Normal left ophthalmic and posterior communicating artery origins. Normal left MCA origin, but the left ACA A1 segment is thread-like, appearing diminutive and non dominant. Non the less, there is left ACA enhancement throughout. No left ACA branch occlusion is identified. The left MCA M1 segment, left MCA bifurcation, and left MCA branches are within normal limits. Venous sinuses: Patent on the delayed images. Anatomic variants: Non dominant and diminutive left ACA A1 segment. Fetal type left PCA. Dominant left vertebral artery. Delayed phase: Early cytotoxic edema is now evident in the right inferior frontal gyrus and right anterior temporal lobe as seen on series 6, image 12. Cytotoxic edema is now evident in the right ACA and anterior right MCA territories above the level of the ventricles on series 6, image 21. No intracranial mass effect. No acute intracranial hemorrhage identified. No ventriculomegaly. No abnormal parenchymal enhancement. Review of the MIP images confirms the above findings IMPRESSION: 1. Positive for Emergent Large Vessel Occlusion but CT Perfusion detects a large volume core infarct in the right MCA and ACA territories. Infarct volume estimated at 250 mL. 2. 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 3. The left ACA A1 segment is non-dominant and diminutive, and CTP suggests  superimposed left ACA/MCA watershed territory ischemia. 4. Cytotoxic edema now evident in the right MCA and right ACA territories. No associated hemorrhage. No intracranial mass effect at this time. 5. Little superimposed atherosclerosis in the head and neck. Therefore consider the possibility of cardiac or paradoxical thromboembolic disease. 6.  Emphysema (ICD10-J43.9). The findings of positive ELVO with large volume core infarct were communicated to Dr.  Doy Mince at 1:32 pmon 11/30/2017 by text page via the [facility] messaging system. Electronically Signed: By: Genevie Ann M.D. On: 11/03/2017 13:34   Dg Chest Port 1 View  Result Date: 11/29/2017 CLINICAL DATA:  Status post intubation EXAM: PORTABLE CHEST 1 VIEW COMPARISON:  None. FINDINGS: Endotracheal tube is noted in satisfactory position. The lungs are well aerated bilaterally. No focal infiltrate or sizable effusion is seen. The cardiac shadow is within normal limits. Under changes in the proximal left humerus are noted. IMPRESSION: Endotracheal tube in satisfactory position. Electronically Signed   By: Inez Catalina M.D.   On: 11/05/2017 15:51   Dg Chest Portable 1 View  Result Date: 11/17/2017 CLINICAL DATA:  Patient fell at home.  No reported chest complaints. EXAM: PORTABLE CHEST 1 VIEW COMPARISON:  None in PACs FINDINGS: The lungs are well-expanded and clear. The heart and pulmonary vascularity are normal. The mediastinum is normal in width. There is no pleural effusion. The observed bony thorax is normal. There is chronic deformity of the left humeral head and proximal shaft. IMPRESSION: There is no active cardiopulmonary disease. Electronically Signed   By: David  Martinique M.D.   On: 11/22/2017 12:06   Dg Abd Portable 1v  Result Date: 11/07/2017 CLINICAL DATA:  OG tube placement. EXAM: PORTABLE ABDOMEN - 1 VIEW COMPARISON:  None. FINDINGS: Enteric tube in place with the tip near the pylorus. Nonobstructive bowel gas pattern. Medullary nephrocalcinosis. IMPRESSION: 1. Appropriately positioned enteric tube. 2. Medullary nephrocalcinosis. Electronically Signed   By: Titus Dubin M.D.   On: 11/06/2017 20:09    PHYSICAL EXAM  Temp:  [102 F (38.9 C)] 102 F (38.9 C) (01/01 1202) Pulse Rate:  [50-113] 113 (01/02 0600) Resp:  [16-26] 16 (01/02 0600) BP: (115-130)/(58-94) 130/64 (01/02 0600) SpO2:  [61 %-100 %] 61 % (01/02 0600) FiO2 (%):  [40 %] 40 % (01/01 1700)  General - Well nourished,  well developed, in coma  Ophthalmologic - fundi not visualized due to noncooperation.  Cardiovascular - Regular rhythm but tachycardia  Neuro - exam limited due to comfort care. Pt in coma, no spontaneous movement, tachypnea, but not in discomfort. Eyes close, left facial droop. Sensation, coordination and gait not tested.  ASSESSMENT/PLAN Mr. George Little is a 62 y.o. 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  Patient has been binge drinking for last 1 week without normal meal.  TPA given but not candidate for mechanical thrombectomy due to large infarct core.    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 Large vessel athero  Resultant coma, uncal herniation  MRI not done due to palliative care  CTA head and neck -right ICA origin occlusion with associated right MCA and right ACA thrombosis.  Left A1 nondominant and a diminutive  CT repeat -no bleeding, but large right MCA and ACA and left ACA infarcts with significant midline shift  2D Echo  not done due to comfort care  LDL 49  HgbA1c 5.0  Diet NPO time specified   On comfort care due to poor prognosis  Cerebral edema with uncal herniation  Off 3% saline  On comfort care  Respiratory failure  One way extubation for comfort care  Heavy alcohol use  Heavy alcohol since teens  Binge drinking for the last one week without normal meal  Frequent pancreatitis flare up  ? 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 They have no questions.   Rosalin Hawking, MD PhD Stroke Neurology Jan 03, 2018 9:45 AM    To contact Stroke Continuity provider, please refer to [email or web address] After hours, contact General Neurology

## 2018-01-03 DEATH — deceased

## 2018-04-28 IMAGING — CT CT HEAD W/O CM
4 series · 16 of 47 positions shown, 18 images · non-contrast
Comparison: 12/02/2017 CT of the head.

CLINICAL DATA: 61 y/o  M; cerebral edema.

EXAM:
CT HEAD WITHOUT CONTRAST
TECHNIQUE: Contiguous axial images were obtained from the base of the skull
through the vertex without intravenous contrast.

[Series 3: head without · axial · non-contrast · 0.45mm/px · z∈[-170,-30]mm · 7 of 38 slices shown, 9 images]
[im 5/38  brain]
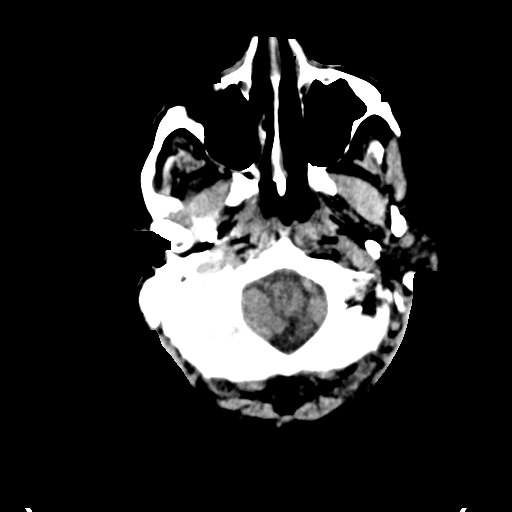
[im 5/38  bone]
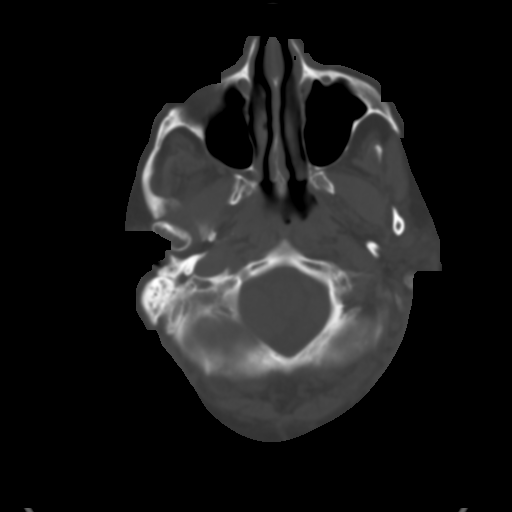
[im 10/38  brain]
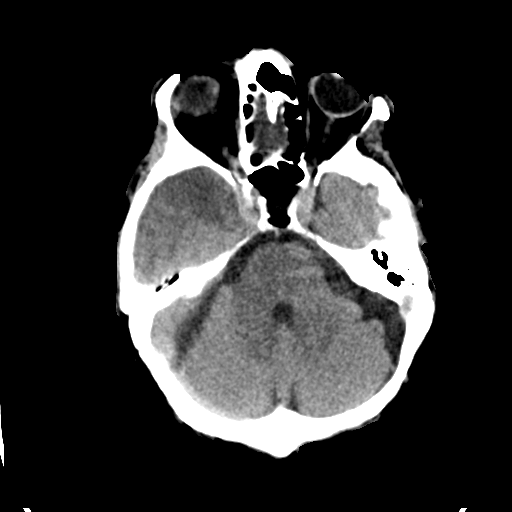
[im 14/38  brain]
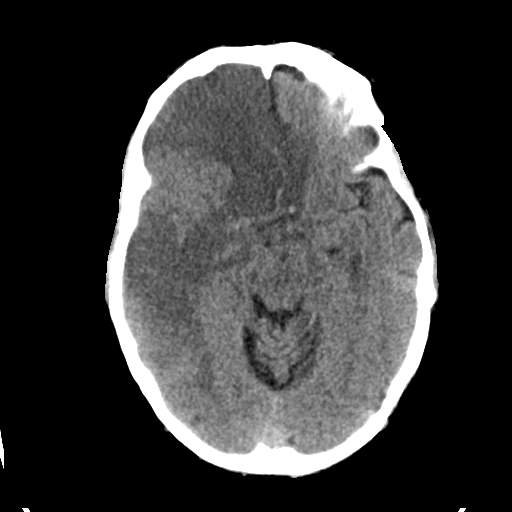
[im 19/38  brain]
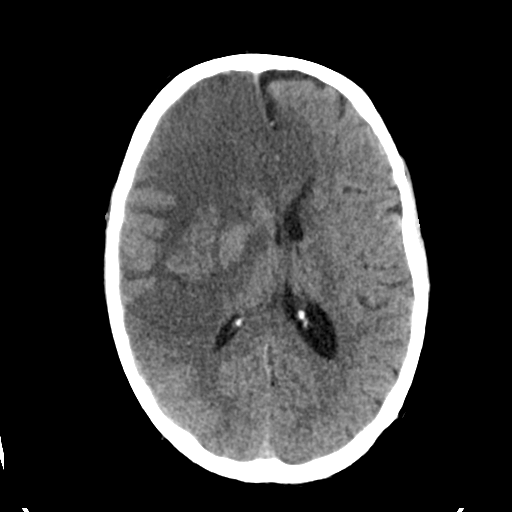
[im 24/38  brain]
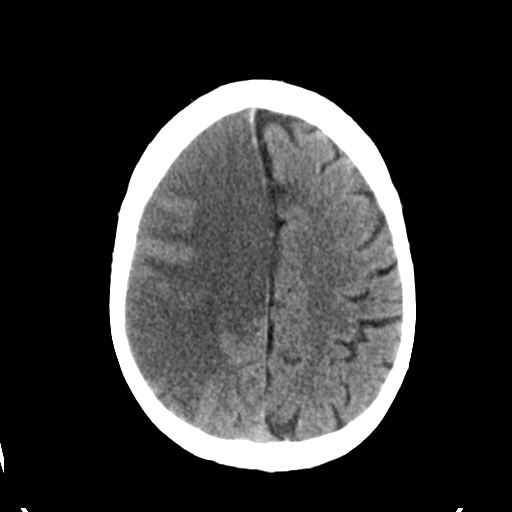
[im 24/38  bone]
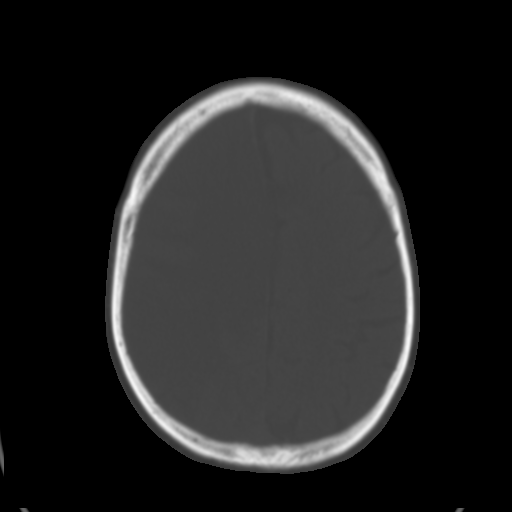
[im 28/38  brain]
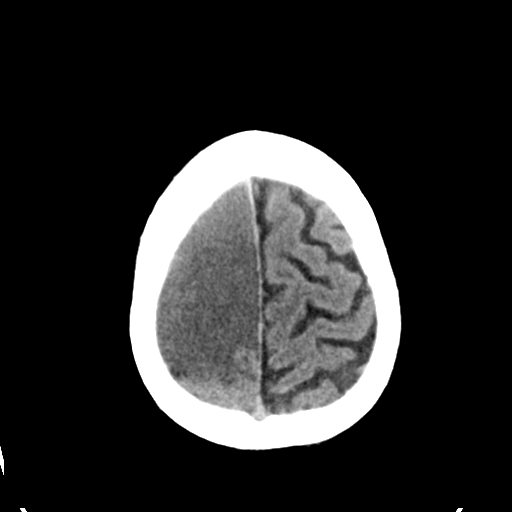
[im 33/38  brain]
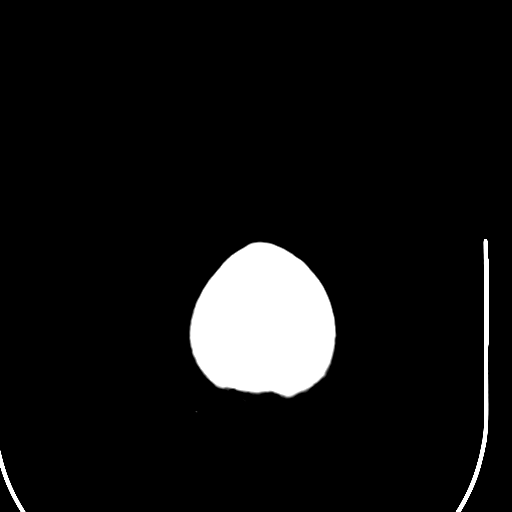

[Series 4: head bone · axial · 0.45mm/px · z∈[-172,-136]mm · 3 of 94 slices shown]
[im 10/94  bone]
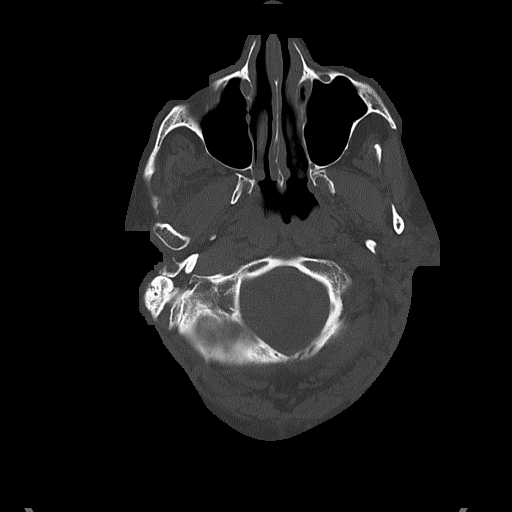
[im 19/94  bone]
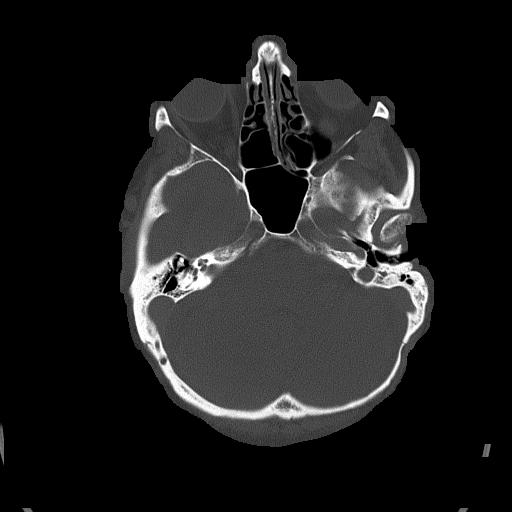
[im 28/94  bone]
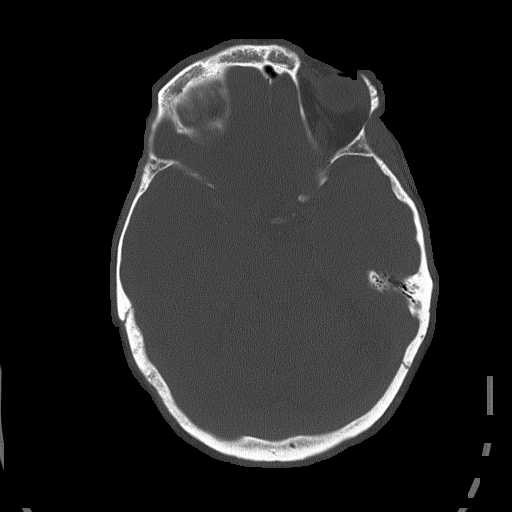

[Series 5: head without cor · coronal · non-contrast · 0.37mm/px · 3 of 71 slices shown]
[im 24/71  brain]
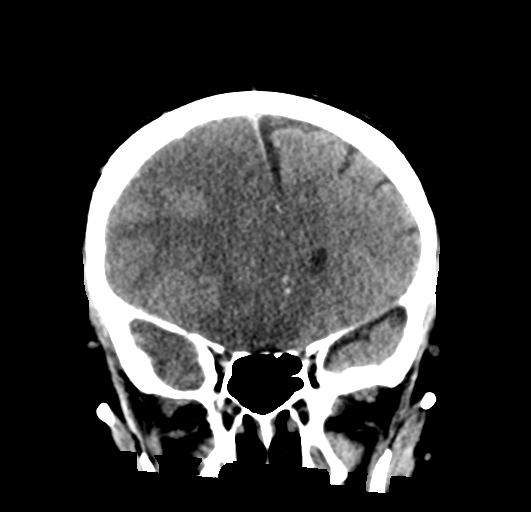
[im 32/71  brain]
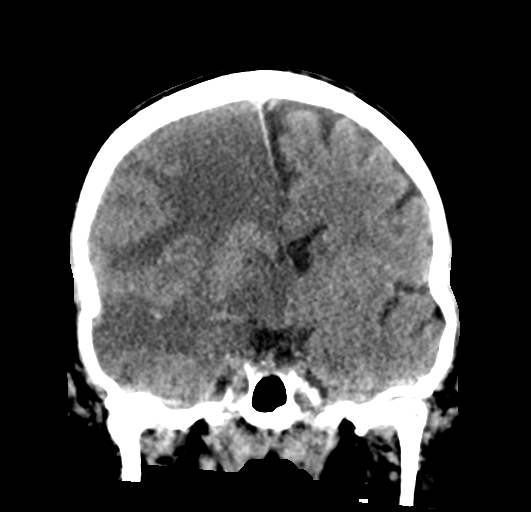
[im 39/71  brain]
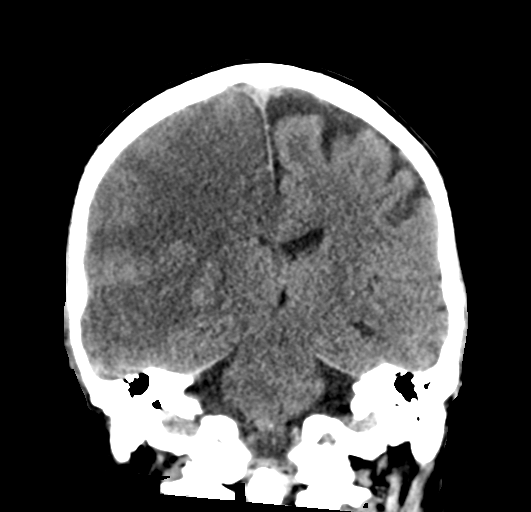

[Series 6: head without sag · sagittal · non-contrast · 0.36mm/px · 3 of 58 slices shown]
[im 20/58  brain]
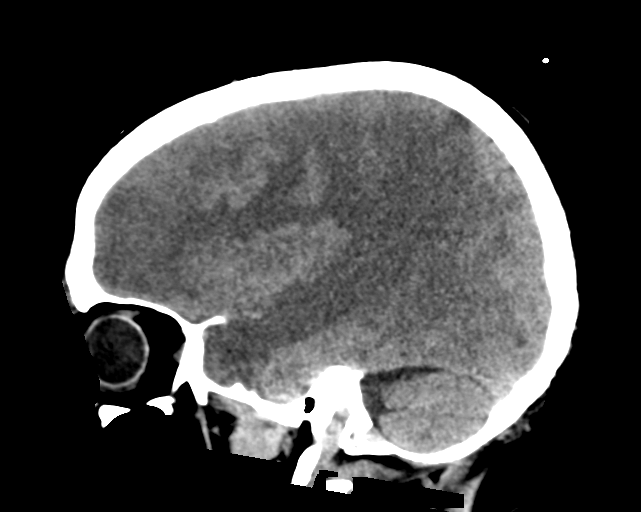
[im 29/58  brain]
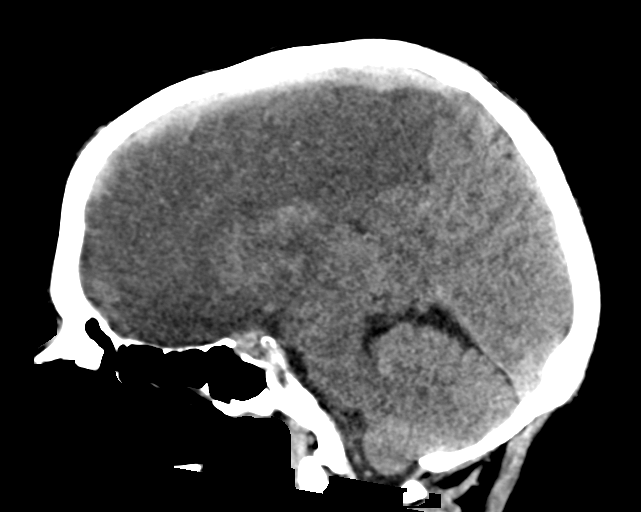
[im 39/58  brain]
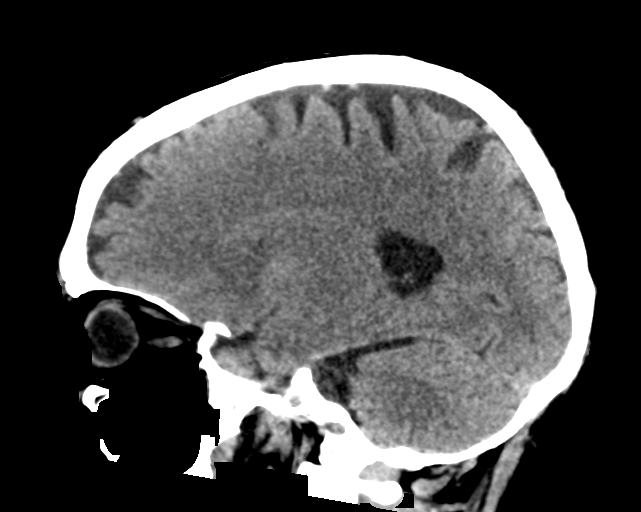

[16 of 47 positions shown; findings below may reference images not displayed]

FINDINGS: Brain: Massive infarction involving right MCA, right ACA and left
anterior ACA territories. Involved brain includes the right anterior
and superior temporal lobe, near entirety of right frontal lobe,
right basal ganglia and posterior limb of internal capsule, right
anterior and lateral parietal lobe, left paramedian frontal lobe,
left caudate head, and genu/ rostrum of corpus callosum.

Edema results in mass effect with diffuse sulcal effacement, 11 mm
right-to-left midline shift, effacement of the right lateral
ventricle and right uncal herniation. Midbrain is displaced leftward
with partial effacement of left quadrigeminal plate cistern. No
downward herniation at this time. No acute hemorrhage identified.

Vascular: Hyperdensity within right terminal ICA and proximal MCA,
probable thrombus.

Skull: Normal. Negative for fracture or focal lesion.

Sinuses/Orbits: No acute finding.

Other: Right intra-ocular lens replacement.
IMPRESSION: 1. Massive infarct involving right MCA, right ACA, and left anterior
ACA territories. No acute hemorrhage.
2. Cerebral edema with diffuse sulcal effacement, 11 mm
right-to-left midline shift, right lateral ventricle effacement, and
right uncal herniation. No downward herniation at this time.
3. Hyperdensity within right terminal ICA and proximal MCA and,
probable thrombus.
Critical Value/emergent results were called by telephone at the time
of interpretation on 12/03/2017 at [DATE] to Dr. CAP GOUVEIA , who
verbally acknowledged these results.

By: Sara Akoto M.D.
